# Patient Record
Sex: Female | Born: 1980 | Race: White | Hispanic: No | Smoking: Never smoker
Health system: Southern US, Community
[De-identification: ages and names within clinical notes are randomized; demographics above are authoritative.]

## PROBLEM LIST (undated history)

## (undated) DIAGNOSIS — R55 Syncope and collapse: Secondary | ICD-10-CM

## (undated) DIAGNOSIS — G43909 Migraine, unspecified, not intractable, without status migrainosus: Secondary | ICD-10-CM

## (undated) DIAGNOSIS — H409 Unspecified glaucoma: Secondary | ICD-10-CM

## (undated) DIAGNOSIS — F419 Anxiety disorder, unspecified: Secondary | ICD-10-CM

## (undated) DIAGNOSIS — B977 Papillomavirus as the cause of diseases classified elsewhere: Secondary | ICD-10-CM

## (undated) HISTORY — PX: DILATION AND CURETTAGE OF UTERUS: SHX78

## (undated) HISTORY — PX: COLONOSCOPY: SHX174

## (undated) HISTORY — DX: Unspecified glaucoma: H40.9

## (undated) HISTORY — PX: EYE SURGERY: SHX253

## (undated) HISTORY — DX: Migraine, unspecified, not intractable, without status migrainosus: G43.909

## (undated) HISTORY — PX: BUNIONECTOMY: SHX129

## (undated) HISTORY — DX: Anxiety disorder, unspecified: F41.9

## (undated) HISTORY — DX: Papillomavirus as the cause of diseases classified elsewhere: B97.7

## (undated) HISTORY — DX: Syncope and collapse: R55

---

## 2018-03-17 HISTORY — PX: COLPOSCOPY: SHX161

## 2018-10-05 DIAGNOSIS — M21619 Bunion of unspecified foot: Secondary | ICD-10-CM | POA: Insufficient documentation

## 2018-10-05 DIAGNOSIS — M2010 Hallux valgus (acquired), unspecified foot: Secondary | ICD-10-CM | POA: Insufficient documentation

## 2018-10-05 DIAGNOSIS — M79672 Pain in left foot: Secondary | ICD-10-CM | POA: Insufficient documentation

## 2018-10-05 DIAGNOSIS — M7740 Metatarsalgia, unspecified foot: Secondary | ICD-10-CM | POA: Insufficient documentation

## 2018-10-05 DIAGNOSIS — M2580 Other specified joint disorders, unspecified joint: Secondary | ICD-10-CM | POA: Insufficient documentation

## 2018-12-17 DIAGNOSIS — M2012 Hallux valgus (acquired), left foot: Secondary | ICD-10-CM | POA: Insufficient documentation

## 2018-12-17 DIAGNOSIS — M21612 Bunion of left foot: Secondary | ICD-10-CM | POA: Insufficient documentation

## 2019-01-03 DIAGNOSIS — Z4889 Encounter for other specified surgical aftercare: Secondary | ICD-10-CM | POA: Insufficient documentation

## 2019-11-01 ENCOUNTER — Encounter: Payer: Self-pay | Admitting: Nurse Practitioner

## 2019-11-01 ENCOUNTER — Other Ambulatory Visit: Payer: Self-pay

## 2019-11-01 ENCOUNTER — Ambulatory Visit (INDEPENDENT_AMBULATORY_CARE_PROVIDER_SITE_OTHER): Payer: Managed Care, Other (non HMO) | Admitting: Nurse Practitioner

## 2019-11-01 VITALS — BP 116/72 | Ht 63.0 in | Wt 135.4 lb

## 2019-11-01 DIAGNOSIS — Z8349 Family history of other endocrine, nutritional and metabolic diseases: Secondary | ICD-10-CM

## 2019-11-01 DIAGNOSIS — Z124 Encounter for screening for malignant neoplasm of cervix: Secondary | ICD-10-CM | POA: Diagnosis not present

## 2019-11-01 DIAGNOSIS — Z13220 Encounter for screening for lipoid disorders: Secondary | ICD-10-CM | POA: Diagnosis not present

## 2019-11-01 DIAGNOSIS — Z01419 Encounter for gynecological examination (general) (routine) without abnormal findings: Secondary | ICD-10-CM

## 2019-11-01 NOTE — Patient Instructions (Addendum)
Schedule mammogram! [facility] [phone] [address]  [place], [place] [address]  Health Maintenance, Female Adopting a healthy lifestyle and getting preventive care are important in promoting health and wellness. Ask your health care provider about:  The right schedule for you to have regular tests and exams.  Things you can do on your own to prevent diseases and keep yourself healthy. What should I know about diet, weight, and exercise? Eat a healthy diet   Eat a diet that includes plenty of vegetables, fruits, low-fat dairy products, and lean protein.  Do not eat a lot of foods that are high in solid fats, added sugars, or sodium. Maintain a healthy weight Body mass index (BMI) is used to identify weight problems. It estimates body fat based on height and weight. Your health care provider can help determine your BMI and help you achieve or maintain a healthy weight. Get regular exercise Get regular exercise. This is one of the most important things you can do for your health. Most adults should:  Exercise for at least 150 minutes each week. The exercise should increase your heart rate and make you sweat (moderate-intensity exercise).  Do strengthening exercises at least twice a week. This is in addition to the moderate-intensity exercise.  Spend less time sitting. Even light physical activity can be beneficial. Watch cholesterol and blood lipids Have your blood tested for lipids and cholesterol at 40 years of age, then have this test every 5 years. Have your cholesterol levels checked more often if:  Your lipid or cholesterol levels are high.  You are older than 40 years of age.  You are at high risk for heart disease. What should I know about cancer screening? Depending on your health history and family history, you may need to have cancer screening at various ages. This may include screening for:  Breast cancer.  Cervical  cancer.  Colorectal cancer.  Skin cancer.  Lung cancer. What should I know about heart disease, diabetes, and high blood pressure? Blood pressure and heart disease  High blood pressure causes heart disease and increases the risk of stroke. This is more likely to develop in people who have high blood pressure readings, are of African descent, or are overweight.  Have your blood pressure checked: ? Every 3-5 years if you are 18-39 years of age. ? Every year if you are 40 years old or older. Diabetes Have regular diabetes screenings. This checks your fasting blood sugar level. Have the screening done:  Once every three years after age 40 if you are at a normal weight and have a low risk for diabetes.  More often and at a younger age if you are overweight or have a high risk for diabetes. What should I know about preventing infection? Hepatitis B If you have a higher risk for hepatitis B, you should be screened for this virus. Talk with your health care provider to find out if you are at risk for hepatitis B infection. Hepatitis C Testing is recommended for:  Everyone born from 1945 through 1965.  Anyone with known risk factors for hepatitis C. Sexually transmitted infections (STIs)  Get screened for STIs, including gonorrhea and chlamydia, if: ? You are sexually active and are younger than 40 years of age. ? You are older than 40 years of age and your health care provider tells you that you are at risk for this type of infection. ? Your sexual activity has changed since you   were last screened, and you are at increased risk for chlamydia or gonorrhea. Ask your health care provider if you are at risk.  Ask your health care provider about whether you are at high risk for HIV. Your health care provider may recommend a prescription medicine to help prevent HIV infection. If you choose to take medicine to prevent HIV, you should first get tested for HIV. You should then be tested every 3  months for as long as you are taking the medicine. Pregnancy  If you are about to stop having your period (premenopausal) and you may become pregnant, seek counseling before you get pregnant.  Take 400 to 800 micrograms (mcg) of folic acid every day if you become pregnant.  Ask for birth control (contraception) if you want to prevent pregnancy. Osteoporosis and menopause Osteoporosis is a disease in which the bones lose minerals and strength with aging. This can result in bone fractures. If you are 65 years old or older, or if you are at risk for osteoporosis and fractures, ask your health care provider if you should:  Be screened for bone loss.  Take a calcium or vitamin D supplement to lower your risk of fractures.  Be given hormone replacement therapy (HRT) to treat symptoms of menopause. Follow these instructions at home: Lifestyle  Do not use any products that contain nicotine or tobacco, such as cigarettes, e-cigarettes, and chewing tobacco. If you need help quitting, ask your health care provider.  Do not use street drugs.  Do not share needles.  Ask your health care provider for help if you need support or information about quitting drugs. Alcohol use  Do not drink alcohol if: ? Your health care provider tells you not to drink. ? You are pregnant, may be pregnant, or are planning to become pregnant.  If you drink alcohol: ? Limit how much you use to 0-1 drink a day. ? Limit intake if you are breastfeeding.  Be aware of how much alcohol is in your drink. In the [place] one drink equals one 12 oz bottle of beer (355 mL), one 5 oz glass of wine (148 mL), or one 1 oz glass of hard liquor (44 mL). General instructions  Schedule regular health, dental, and eye exams.  Stay current with your vaccines.  Tell your health care provider if: ? You often feel depressed. ? You have ever been abused or do not feel safe at home. Summary  Adopting a healthy lifestyle and getting  preventive care are important in promoting health and wellness.  Follow your health care provider's instructions about healthy diet, exercising, and getting tested or screened for diseases.  Follow your health care provider's instructions on monitoring your cholesterol and blood pressure. This information is not intended to replace advice given to you by your health care provider. Make sure you discuss any questions you have with your health care provider. Document Revised: 02/24/2018 Document Reviewed: 02/24/2018 Elsevier Patient Education  2020 Elsevier Inc.  

## 2019-11-01 NOTE — Addendum Note (Signed)
Addended by: Tito Dine on: 11/01/2019 11:27 AM   Modules accepted: Orders

## 2019-11-01 NOTE — Progress Notes (Signed)
   Paula Crosby [birth year] [id number]   History:  39 y.o. [id number] presents as new patient to establish care without GYN complaints. Amenorrheic/Mirena IUD inserted June 2020. Sexually active with same female partner. 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 She will have records sent here.   Gynecologic History No LMP recorded. (Menstrual status: IUD).   Contraception: IUD Last Pap: 2020 per patient. 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  Colonoscopy: 2018  Past medical history, past surgical history, family history and social history were all reviewed and documented in the EPIC chart.  ROS:  A ROS was performed and pertinent positives and negatives are included.  Exam:  Vitals:   11/01/19 1028  Weight: 135 lb 6.4 oz (61.4 kg)  Height: 5\' 3"  (1.6 m)   Body mass index is 23.99 kg/m.  General appearance:  Normal Thyroid:  Symmetrical, normal in size, without palpable masses or nodularity. Respiratory  Auscultation:  Clear without wheezing or rhonchi Cardiovascular  Auscultation:  Regular rate, without rubs, murmurs or gallops  Edema/varicosities:  Not grossly evident Abdominal  Soft,nontender, without masses, guarding or rebound.  Liver/spleen:  No organomegaly noted  Hernia:  None appreciated  Skin  Inspection:  Grossly normal   Breasts: Examined lying and sitting.   Right: Without masses, retractions, discharge or axillary adenopathy.   Left: Without masses, retractions, discharge or axillary adenopathy. Gentitourinary   Inguinal/mons:  Normal without inguinal adenopathy  External genitalia:  Normal  BUS/Urethra/Skene's glands:  Normal  Vagina:  Normal  Cervix:  Normal, IUD string visible in os  Uterus:  Anteverted, normal in size, shape and contour.  Midline and mobile  Adnexa/parametria:     Rt: Without masses or tenderness.   Lt: Without masses or tenderness.  Anus and perineum: Normal   Assessment/Plan:  39 y.o. 24 as  new patient.   Well female exam with routine gynecological exam - Plan: CBC with Differential/Platelet, Comprehensive metabolic panel. Education provided on SBEs, importance of preventative screenings, current guidelines, high calcium diet, regular exercise, and multivitamin daily.   Lipid screening - Plan: Lipid panel  Family history of thyroid disease - Plan: TSH  Encounter for Papanicolaou smear for cervical cancer screening - first abnormal pap in 2020, CIN II per patient. Repeat pap with HR HPV today.  Screening for breast cancer - discussed current guidelines and importance of annual screenings. She will start those this year. Information provided on the breast center.   Follow up in 1 year for annual       2021 [facility], 10:35 AM 11/01/2019

## 2019-11-02 LAB — CBC WITH DIFFERENTIAL/PLATELET
Absolute Monocytes: 506 cells/uL (ref 200–950)
Basophils Absolute: 39 cells/uL (ref 0–200)
Basophils Relative: 0.7 %
Eosinophils Absolute: 39 cells/uL (ref 15–500)
Eosinophils Relative: 0.7 %
HCT: 39.2 % (ref 35.0–45.0)
Hemoglobin: 13.4 g/dL (ref 11.7–15.5)
Lymphs Abs: 1551 cells/uL (ref 850–3900)
MCH: 32.5 pg (ref 27.0–33.0)
MCHC: 34.2 g/dL (ref 32.0–36.0)
MCV: 95.1 fL (ref 80.0–100.0)
MPV: 9.9 fL (ref 7.5–12.5)
Monocytes Relative: 9.2 %
Neutro Abs: 3366 cells/uL (ref 1500–7800)
Neutrophils Relative %: 61.2 %
Platelets: 298 10*3/uL (ref 140–400)
RBC: 4.12 10*6/uL (ref 3.80–5.10)
RDW: 12 % (ref 11.0–15.0)
Total Lymphocyte: 28.2 %
WBC: 5.5 10*3/uL (ref 3.8–10.8)

## 2019-11-02 LAB — TSH: TSH: 1.89 mIU/L

## 2019-11-02 LAB — PAP, TP IMAGING W/ HPV RNA, RFLX HPV TYPE 16,18/45: HPV DNA High Risk: NOT DETECTED

## 2019-11-02 LAB — COMPREHENSIVE METABOLIC PANEL
AG Ratio: 1.8 (calc) (ref 1.0–2.5)
ALT: 9 U/L (ref 6–29)
AST: 12 U/L (ref 10–30)
Albumin: 4.6 g/dL (ref 3.6–5.1)
Alkaline phosphatase (APISO): 37 U/L (ref 31–125)
BUN: 7 mg/dL (ref 7–25)
CO2: 27 mmol/L (ref 20–32)
Calcium: 9.4 mg/dL (ref 8.6–10.2)
Chloride: 103 mmol/L (ref 98–110)
Creat: 0.74 mg/dL (ref 0.50–1.10)
Globulin: 2.5 g/dL (calc) (ref 1.9–3.7)
Glucose, Bld: 86 mg/dL (ref 65–99)
Potassium: 4 mmol/L (ref 3.5–5.3)
Sodium: 138 mmol/L (ref 135–146)
Total Bilirubin: 0.6 mg/dL (ref 0.2–1.2)
Total Protein: 7.1 g/dL (ref 6.1–8.1)

## 2019-11-02 LAB — LIPID PANEL
Cholesterol: 172 mg/dL (ref <200)
HDL: 79 mg/dL (ref >=50)
LDL Cholesterol (Calc): 80 mg/dL (calc)
Non-HDL Cholesterol (Calc): 93 mg/dL (calc) (ref <130)
Total CHOL/HDL Ratio: 2.2 (calc) (ref <5.0)
Triglycerides: 54 mg/dL (ref <150)

## 2020-04-12 ENCOUNTER — Other Ambulatory Visit: Payer: Self-pay | Admitting: Nurse Practitioner

## 2020-04-12 DIAGNOSIS — Z1231 Encounter for screening mammogram for malignant neoplasm of breast: Secondary | ICD-10-CM

## 2020-05-07 ENCOUNTER — Other Ambulatory Visit: Payer: Self-pay

## 2020-05-07 ENCOUNTER — Ambulatory Visit
Admission: RE | Admit: 2020-05-07 | Discharge: 2020-05-07 | Disposition: A | Discharge status: Home / Self Care | Payer: Managed Care, Other (non HMO) | Source: Ambulatory Visit

## 2020-05-07 DIAGNOSIS — Z1231 Encounter for screening mammogram for malignant neoplasm of breast: Secondary | ICD-10-CM

## 2020-05-16 ENCOUNTER — Other Ambulatory Visit: Payer: Self-pay

## 2020-05-16 ENCOUNTER — Ambulatory Visit (INDEPENDENT_AMBULATORY_CARE_PROVIDER_SITE_OTHER): Payer: Managed Care, Other (non HMO) | Admitting: Internal Medicine

## 2020-05-16 ENCOUNTER — Encounter: Payer: Self-pay | Admitting: Internal Medicine

## 2020-05-16 VITALS — BP 102/70 | HR 60 | Temp 98.3°F | Resp 16 | Ht 63.0 in | Wt 142.0 lb

## 2020-05-16 DIAGNOSIS — G43011 Migraine without aura, intractable, with status migrainosus: Secondary | ICD-10-CM

## 2020-05-16 DIAGNOSIS — Z0000 Encounter for general adult medical examination without abnormal findings: Secondary | ICD-10-CM | POA: Diagnosis not present

## 2020-05-16 DIAGNOSIS — H409 Unspecified glaucoma: Secondary | ICD-10-CM

## 2020-05-16 DIAGNOSIS — Z0001 Encounter for general adult medical examination with abnormal findings: Secondary | ICD-10-CM

## 2020-05-16 MED ORDER — NURTEC 75 MG PO TBDP: 1.0000 | ORAL_TABLET | ORAL | 1 refills | Status: DC

## 2020-05-16 NOTE — Patient Instructions (Signed)

## 2020-05-16 NOTE — Progress Notes (Addendum)
Subjective:  Patient ID: Paula Crosby, female    DOB: 12-16-1980  Age: 40 y.o. MRN: [id number]  CC: Headache and Annual Exam  This visit occurred during the SARS-CoV-2 public health emergency.  Safety protocols were in place, including screening questions prior to the visit, additional usage of staff PPE, and extensive cleaning of exam room while observing appropriate contact time as indicated for disinfecting solutions.    HPI Frona Yost presents for a CPX and to establish.  She describes intermittent headaches for greater than a year.  She describes the headache as a pounding/throbbing sensation that is always over her right lateral eyebrow.  The pain can be distracting.  She has rare nausea but never vomits.  She says the pain increases whenever she drinks alcohol.  She has gotten modest symptom relief with Tylenol and Excedrin.  She tells me she has no nocturnal headaches and denies slurred speech, visual disturbance, ataxia, or paresthesias.  The headaches have become more severe and more frequent over the last year.  She has a history of glaucoma and is followed closely by ophthalmology.  History Sheresa has a past medical history of Anxiety and HPV (human papilloma virus) infection.   She has a past surgical history that includes Colposcopy (2020); Dilation and curettage of uterus; and Colonoscopy.   Her family history includes Alcohol abuse in her brother and mother; Congestive Heart Failure in her maternal grandfather; Depression in her brother and mother; Diabetes in her maternal grandfather; Heart disease in her maternal grandfather; Hypertension in her brother and maternal grandfather; Migraines in her brother and mother.She reports that she has never smoked. She has never used smokeless tobacco. She reports previous alcohol use. She reports that she does not use drugs.  Outpatient Medications Prior to Visit  Medication Sig Dispense Refill  . melatonin 1 MG TABS tablet Take 3 mg by  mouth at bedtime.    . sertraline (ZOLOFT) 100 MG tablet Take 100 mg by mouth daily.     No facility-administered medications prior to visit.    ROS Review of Systems  Constitutional: Negative.  Negative for appetite change, diaphoresis and fatigue.  HENT: Negative.   Eyes: Negative for pain and visual disturbance.  Respiratory: Negative for cough, chest tightness, shortness of breath and wheezing.   Cardiovascular: Negative for chest pain, palpitations and leg swelling.  Gastrointestinal: Positive for nausea. Negative for abdominal pain and vomiting.  Endocrine: Negative.   Genitourinary: Negative.  Negative for difficulty urinating.  Musculoskeletal: Negative for back pain, myalgias and neck pain.  Skin: Negative.   Neurological: Positive for headaches. Negative for dizziness, weakness, light-headedness and numbness.  Hematological: Negative for adenopathy. Does not bruise/bleed easily.  Psychiatric/Behavioral: Negative.     Objective:  BP 102/70   Pulse 60   Temp 98.3 F (36.8 C) (Oral)   Resp 16   Ht 5\' 3"  (1.6 m)   Wt 142 lb (64.4 kg)   SpO2 99%   BMI 25.15 kg/m   Physical Exam Vitals reviewed.  Constitutional:      General: She is not in acute distress.    Appearance: She is well-developed. She is not ill-appearing, toxic-appearing or diaphoretic.  HENT:     Nose: Nose normal.     Mouth/Throat:     Mouth: Mucous membranes are moist.  Eyes:     General: No scleral icterus.    Extraocular Movements: Extraocular movements intact.     Pupils: Pupils are equal, round, and reactive to light.  Cardiovascular:     Rate and Rhythm: Normal rate and regular rhythm.     Heart sounds: No murmur heard.   Pulmonary:     Effort: Pulmonary effort is normal.     Breath sounds: No stridor. No wheezing, rhonchi or rales.  Abdominal:     General: Abdomen is flat. Bowel sounds are normal. There is no distension.     Palpations: Abdomen is soft. There is no hepatomegaly.      Tenderness: There is no abdominal tenderness.  Musculoskeletal:        General: Normal range of motion.     Cervical back: Neck supple.     Right lower leg: No edema.     Left lower leg: No edema.  Lymphadenopathy:     Cervical: No cervical adenopathy.  Skin:    General: Skin is warm and dry.     Coloration: Skin is not pale.  Neurological:     General: No focal deficit present.     Mental Status: She is alert and oriented to person, place, and time. Mental status is at baseline.     Cranial Nerves: No cranial nerve deficit.     Sensory: No sensory deficit.     Motor: No weakness.     Coordination: Coordination is intact. Romberg sign negative. Coordination normal. Finger-Nose-Finger Test and Heel to [facility] Test normal. Rapid alternating movements normal.     Gait: Gait normal.     Deep Tendon Reflexes: Reflexes normal. Babinski sign absent on the right side. Babinski sign absent on the left side.     Reflex Scores:      Tricep reflexes are 1+ on the right side and 1+ on the left side.      Bicep reflexes are 1+ on the right side and 1+ on the left side.      Brachioradialis reflexes are 1+ on the right side and 1+ on the left side.      Patellar reflexes are 2+ on the right side and 2+ on the left side.      Achilles reflexes are 1+ on the right side and 1+ on the left side. Psychiatric:        Mood and Affect: Mood normal.        Behavior: Behavior normal.     Lab Results  Component Value Date   WBC 5.5 11/01/2019   HGB 13.4 11/01/2019   HCT 39.2 11/01/2019   PLT 298 11/01/2019   GLUCOSE 86 11/01/2019   CHOL 172 11/01/2019   TRIG 54 11/01/2019   HDL 79 11/01/2019   LDLCALC 80 11/01/2019   ALT 9 11/01/2019   AST 12 11/01/2019   NA 138 11/01/2019   K 4.0 11/01/2019   CL 103 11/01/2019   CREATININE 0.74 11/01/2019   BUN 7 11/01/2019   CO2 27 11/01/2019   TSH 1.89 11/01/2019    Assessment & Plan:   Jonne was seen today for headache and annual exam.  Diagnoses  and all orders for this visit:  Intractable migraine without aura and with status migrainosus- She has frequent and severe migraines.  I recommended that she take a CGRP antagonist to prevent and treat migraines. -     Rimegepant Sulfate (NURTEC) 75 MG TBDP; Take 1 tablet by mouth every other day.  Glaucoma of both eyes, unspecified glaucoma type -     Ambulatory referral to Ophthalmology  Encounter for general adult medical examination with abnormal findings- Exam completed, labs reviewed, vaccines  reviewed - She refused a flu vaccine, cancer screenings and labs are up-to-date, patient education material was given.   I am having Gwynn Burly start on Nurtec. I am also having her maintain her sertraline and melatonin.  Meds ordered this encounter  Medications  . Rimegepant Sulfate (NURTEC) 75 MG TBDP    Sig: Take 1 tablet by mouth every other day.    Dispense:  48 tablet    Refill:  1     Follow-up: Return in about 3 months (around 08/16/2020).  Sanda Linger, MD

## 2020-05-21 ENCOUNTER — Encounter: Payer: Self-pay | Admitting: Internal Medicine

## 2020-05-21 DIAGNOSIS — Z0001 Encounter for general adult medical examination with abnormal findings: Secondary | ICD-10-CM | POA: Insufficient documentation

## 2020-05-23 ENCOUNTER — Encounter: Payer: Self-pay | Admitting: Internal Medicine

## 2020-06-08 ENCOUNTER — Telehealth: Payer: Self-pay | Admitting: Internal Medicine

## 2020-06-08 NOTE — Telephone Encounter (Signed)
[facility] called in regards of the [place] for Rimegepant Sulfate (NURTEC) 75 MG TBDP     Phone: [phone]

## 2020-08-29 ENCOUNTER — Other Ambulatory Visit: Payer: Self-pay | Admitting: Internal Medicine

## 2020-08-29 ENCOUNTER — Telehealth: Payer: Self-pay | Admitting: Internal Medicine

## 2020-08-29 DIAGNOSIS — G43011 Migraine without aura, intractable, with status migrainosus: Secondary | ICD-10-CM

## 2020-08-29 MED ORDER — NURTEC 75 MG PO TBDP: 1.0000 | ORAL_TABLET | ORAL | 1 refills | Status: DC

## 2020-08-29 NOTE — Telephone Encounter (Signed)
    Patient has no medication remaining  Request to refill Rimegepant Sulfate (NURTEC) 75 MG TBDP  Pharmacy [facility] [place] (New Address) - [place], [place] - [address] AT Previously: [address], [place]

## 2020-11-01 ENCOUNTER — Encounter: Payer: Self-pay | Admitting: Nurse Practitioner

## 2020-11-01 ENCOUNTER — Other Ambulatory Visit: Payer: Self-pay

## 2020-11-01 ENCOUNTER — Ambulatory Visit (INDEPENDENT_AMBULATORY_CARE_PROVIDER_SITE_OTHER): Payer: Managed Care, Other (non HMO) | Admitting: Nurse Practitioner

## 2020-11-01 VITALS — BP 116/74 | Ht 64.0 in | Wt 142.0 lb

## 2020-11-01 DIAGNOSIS — B9689 Other specified bacterial agents as the cause of diseases classified elsewhere: Secondary | ICD-10-CM | POA: Diagnosis not present

## 2020-11-01 DIAGNOSIS — Z01419 Encounter for gynecological examination (general) (routine) without abnormal findings: Secondary | ICD-10-CM | POA: Diagnosis not present

## 2020-11-01 DIAGNOSIS — N941 Unspecified dyspareunia: Secondary | ICD-10-CM | POA: Diagnosis not present

## 2020-11-01 DIAGNOSIS — N898 Other specified noninflammatory disorders of vagina: Secondary | ICD-10-CM | POA: Diagnosis not present

## 2020-11-01 DIAGNOSIS — N760 Acute vaginitis: Secondary | ICD-10-CM

## 2020-11-01 DIAGNOSIS — Z8349 Family history of other endocrine, nutritional and metabolic diseases: Secondary | ICD-10-CM

## 2020-11-01 DIAGNOSIS — Z30431 Encounter for routine checking of intrauterine contraceptive device: Secondary | ICD-10-CM

## 2020-11-01 LAB — COMPREHENSIVE METABOLIC PANEL
AG Ratio: 2.1 (calc) (ref 1.0–2.5)
ALT: 10 U/L (ref 6–29)
AST: 12 U/L (ref 10–30)
Albumin: 4.4 g/dL (ref 3.6–5.1)
Alkaline phosphatase (APISO): 40 U/L (ref 31–125)
BUN: 11 mg/dL (ref 7–25)
CO2: 26 mmol/L (ref 20–32)
Calcium: 9.2 mg/dL (ref 8.6–10.2)
Chloride: 107 mmol/L (ref 98–110)
Creat: 0.78 mg/dL (ref 0.50–0.99)
Globulin: 2.1 g/dL (calc) (ref 1.9–3.7)
Glucose, Bld: 79 mg/dL (ref 65–99)
Potassium: 4 mmol/L (ref 3.5–5.3)
Sodium: 140 mmol/L (ref 135–146)
Total Bilirubin: 0.5 mg/dL (ref 0.2–1.2)
Total Protein: 6.5 g/dL (ref 6.1–8.1)

## 2020-11-01 LAB — LIPID PANEL
Cholesterol: 149 mg/dL (ref <200)
HDL: 76 mg/dL (ref >=50)
LDL Cholesterol (Calc): 61 mg/dL (calc)
Non-HDL Cholesterol (Calc): 73 mg/dL (calc) (ref <130)
Total CHOL/HDL Ratio: 2 (calc) (ref <5.0)
Triglycerides: 43 mg/dL (ref <150)

## 2020-11-01 LAB — CBC WITH DIFFERENTIAL/PLATELET
Absolute Monocytes: 570 cells/uL (ref 200–950)
Basophils Absolute: 70 cells/uL (ref 0–200)
Basophils Relative: 1.4 %
Eosinophils Absolute: 200 cells/uL (ref 15–500)
Eosinophils Relative: 4 %
HCT: 38.6 % (ref 35.0–45.0)
Hemoglobin: 12.9 g/dL (ref 11.7–15.5)
Lymphs Abs: 1535 cells/uL (ref 850–3900)
MCH: 32.5 pg (ref 27.0–33.0)
MCHC: 33.4 g/dL (ref 32.0–36.0)
MCV: 97.2 fL (ref 80.0–100.0)
MPV: 10.2 fL (ref 7.5–12.5)
Monocytes Relative: 11.4 %
Neutro Abs: 2625 cells/uL (ref 1500–7800)
Neutrophils Relative %: 52.5 %
Platelets: 272 10*3/uL (ref 140–400)
RBC: 3.97 10*6/uL (ref 3.80–5.10)
RDW: 11.7 % (ref 11.0–15.0)
Total Lymphocyte: 30.7 %
WBC: 5 10*3/uL (ref 3.8–10.8)

## 2020-11-01 LAB — WET PREP FOR TRICH, YEAST, CLUE

## 2020-11-01 LAB — TSH: TSH: 1.88 mIU/L

## 2020-11-01 MED ORDER — METRONIDAZOLE 500 MG PO TABS: 500.0000 mg | ORAL_TABLET | Freq: Two times a day (BID) | ORAL | 0 refills | Status: DC

## 2020-11-01 NOTE — Progress Notes (Signed)
Paula Crosby [birth year] [id number]   History:  40 y.o. [id number] presents for annual exam. She complains of vaginal discharge with odor and painful intercourse x 1 month. Amenorrheic/Mirena IUD inserted June 2020. She reports abnormal pap in 2020 in [place], requested records but we have not received them. Normal pap last year. Sexually active with long-term partner.    Gynecologic History No LMP recorded. (Menstrual status: IUD).   Contraception: IUD  Health Maintenance: Last Pap: 11/01/2019 Results were: Normal, 3-year repeat Last mammogram: 05/07/2020. Results were: Normal Last colonoscopy: 2018 Last Dexa: Not indicated  Past medical history, past surgical history, family history and social history were all reviewed and documented in the EPIC chart. 2 sons in grades 3rd and 8th.   ROS:  A ROS was performed and pertinent positives and negatives are included.  Exam:  Vitals:   11/01/20 0759  BP: 116/74  Weight: 142 lb (64.4 kg)  Height: 5\' 4"  (1.626 m)   Body mass index is 24.37 kg/m.  General appearance:  Normal Thyroid:  Symmetrical, normal in size, without palpable masses or nodularity. Respiratory  Auscultation:  Clear without wheezing or rhonchi Cardiovascular  Auscultation:  Regular rate, without rubs, murmurs or gallops  Edema/varicosities:  Not grossly evident Abdominal  Soft,nontender, without masses, guarding or rebound.  Liver/spleen:  No organomegaly noted  Hernia:  None appreciated  Skin  Inspection:  Grossly normal   Breasts: Examined lying and sitting.   Right: Without masses, retractions, discharge or axillary adenopathy.   Left: Without masses, retractions, discharge or axillary adenopathy. Gentitourinary   Inguinal/mons:  Normal without inguinal adenopathy  External genitalia:  Normal  BUS/Urethra/Skene's glands:  Normal  Vagina:  Discharge present  Cervix:  Normal, IUD string visible in os  Uterus:  Anteverted, normal in size, shape and contour.   Midline and mobile  Adnexa/parametria:     Rt: Without masses or tenderness.   Lt: Without masses or tenderness.  Anus and perineum: Normal  Patient informed chaperone available to be present for breast and pelvic exam. Patient has requested no chaperone to be present. Patient has been advised what will be completed during breast and pelvic exam.   Wet prep + clue cells  Assessment/Plan:  40 y.o. 40 as new patient.   Well female exam with routine gynecological exam - Plan: CBC with Differential/Platelet, Comprehensive metabolic panel, Lipid panel. Education provided on SBEs, importance of preventative screenings, current guidelines, high calcium diet, regular exercise, and multivitamin daily.  Family history of thyroid disease - Plan: TSH  Encounter for routine checking of intrauterine contraceptive device (IUD) - Mirena inserted 08/2018. Amenorrheic.   Vaginal discharge - Plan: SURESWAB CT/NG/T. vaginalis, WET PREP FOR TRICH, YEAST, CLUE. Discharge with odor x 1 month.   Dyspareunia in female - Plan: SURESWAB CT/NG/T. vaginalis, WET PREP FOR TRICH, YEAST, CLUE. Painful intercourse x 1 month around the time discharge and odor began.   Bacterial vaginosis - Plan: metroNIDAZOLE (FLAGYL) 500 MG tablet twicce daily x 7 days.  Screening for cervical cancer - Reports abnormal pap in 2020 in [place] confirmed by biopsy, no intervention done. 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 Normal pap last year.  Will repeat at 3-year interval per guidelines.  Screening for breast cancer - Normal mammogram history.  Continue annual screenings.  Normal breast exam today.  Screening for colon cancer - Average risk. Will start screenings at age 40.   Follow up in 1 year for annual  [facility], 8:29 AM 11/01/2020

## 2020-11-02 LAB — SURESWAB CT/NG/T. VAGINALIS
C. trachomatis RNA, TMA: NOT DETECTED
N. gonorrhoeae RNA, TMA: NOT DETECTED
Trichomonas vaginalis RNA: NOT DETECTED

## 2020-11-27 ENCOUNTER — Telehealth: Payer: Self-pay

## 2020-11-27 NOTE — Telephone Encounter (Signed)
Please advise as the pt has called asking for a med refill of her Rimegepant Sulfate (NURTEC) 75 MG TBDP.

## 2020-11-28 NOTE — Telephone Encounter (Signed)
I was able to schedule pt a visit with Dr. Yetta Barre for 12/25/2020.

## 2020-12-07 ENCOUNTER — Telehealth: Payer: Self-pay | Admitting: Internal Medicine

## 2020-12-07 DIAGNOSIS — G43011 Migraine without aura, intractable, with status migrainosus: Secondary | ICD-10-CM

## 2020-12-07 MED ORDER — NURTEC 75 MG PO TBDP
1.0000 | ORAL_TABLET | ORAL | 1 refills | Status: DC
Start: 2020-12-07 — End: 2020-12-25

## 2020-12-07 NOTE — Telephone Encounter (Signed)
1.Medication Requested: Rimegepant Sulfate (NURTEC) 75 MG TBDP  2. Pharmacy (Name, Street, [place]): [facility], [place] (New Address) - [place], [place] - [address] AT Previously: [address]  Phone:  [phone] Fax:  [phone]   3. On Med List: yes  4. Last Visit with PCP: 03.02.22  5. Next visit date with PCP: 10.11.22   Agent: Please be advised that RX refills may take up to 3 business days. We ask that you follow-up with your pharmacy.

## 2020-12-25 ENCOUNTER — Ambulatory Visit (INDEPENDENT_AMBULATORY_CARE_PROVIDER_SITE_OTHER): Payer: Managed Care, Other (non HMO) | Admitting: Internal Medicine

## 2020-12-25 ENCOUNTER — Encounter: Payer: Self-pay | Admitting: Internal Medicine

## 2020-12-25 ENCOUNTER — Other Ambulatory Visit: Payer: Self-pay

## 2020-12-25 DIAGNOSIS — G43011 Migraine without aura, intractable, with status migrainosus: Secondary | ICD-10-CM

## 2020-12-25 MED ORDER — NURTEC 75 MG PO TBDP: 1.0000 | ORAL_TABLET | ORAL | 1 refills | Status: DC

## 2020-12-25 NOTE — Patient Instructions (Signed)
Migraine Headache A migraine headache is an intense, throbbing pain on one side or both sides of the head. Migraine headaches may also cause other symptoms, such as nausea, vomiting, and sensitivity to light and noise. A migraine headache can last from 4 hours to 3 days. Talk with your doctor about what things may bring on (trigger) your migraine headaches. What are the causes? The exact cause of this condition is not known. However, a migraine may be caused when nerves in the brain become irritated and release chemicals that cause inflammation of blood vessels. This inflammation causes pain. This condition may be triggered or caused by: Drinking alcohol. Smoking. Taking medicines, such as: Medicine used to treat chest pain (nitroglycerin). Birth control pills. Estrogen. Certain blood pressure medicines. Eating or drinking products that contain nitrates, glutamate, aspartame, or tyramine. Aged cheeses, chocolate, or caffeine may also be triggers. Doing physical activity. Other things that may trigger a migraine headache include: Menstruation. Pregnancy. Hunger. Stress. Lack of sleep or too much sleep. Weather changes. Fatigue. What increases the risk? The following factors may make you more likely to experience migraine headaches: Being a certain age. This condition is more common in people who are 25-55 years old. Being female. Having a family history of migraine headaches. Being Caucasian. Having a mental health condition, such as depression or anxiety. Being obese. What are the signs or symptoms? The main symptom of this condition is pulsating or throbbing pain. This pain may: Happen in any area of the head, such as on one side or both sides. Interfere with daily activities. Get worse with physical activity. Get worse with exposure to bright lights or loud noises. Other symptoms may include: Nausea. Vomiting. Dizziness. General sensitivity to bright lights, loud noises, or  smells. Before you get a migraine headache, you may get warning signs (an aura). An aura may include: Seeing flashing lights or having blind spots. Seeing bright spots, halos, or zigzag lines. Having tunnel vision or blurred vision. Having numbness or a tingling feeling. Having trouble talking. Having muscle weakness. Some people have symptoms after a migraine headache (postdromal phase), such as: Feeling tired. Difficulty concentrating. How is this diagnosed? A migraine headache can be diagnosed based on: Your symptoms. A physical exam. Tests, such as: CT scan or an MRI of the head. These imaging tests can help rule out other causes of headaches. Taking fluid from the spine (lumbar puncture) and analyzing it (cerebrospinal fluid analysis, or CSF analysis). How is this treated? This condition may be treated with medicines that: Relieve pain. Relieve nausea. Prevent migraine headaches. Treatment for this condition may also include: Acupuncture. Lifestyle changes like avoiding foods that trigger migraine headaches. Biofeedback. Cognitive behavioral therapy. Follow these instructions at home: Medicines Take over-the-counter and prescription medicines only as told by your health care provider. Ask your health care provider if the medicine prescribed to you: Requires you to avoid driving or using heavy machinery. Can cause constipation. You may need to take these actions to prevent or treat constipation: Drink enough fluid to keep your urine pale yellow. Take over-the-counter or prescription medicines. Eat foods that are high in fiber, such as beans, whole grains, and fresh fruits and vegetables. Limit foods that are high in fat and processed sugars, such as fried or sweet foods. Lifestyle Do not drink alcohol. Do not use any products that contain nicotine or tobacco, such as cigarettes, e-cigarettes, and chewing tobacco. If you need help quitting, ask your health care  provider. Get at least 8   hours of sleep every night. Find ways to manage stress, such as meditation, deep breathing, or yoga. General instructions   Keep a journal to find out what may trigger your migraine headaches. For example, write down: What you eat and drink. How much sleep you get. Any change to your diet or medicines. If you have a migraine headache: Avoid things that make your symptoms worse, such as bright lights. It may help to lie down in a dark, quiet room. Do not drive or use heavy machinery. Ask your health care provider what activities are safe for you while you are experiencing symptoms. Keep all follow-up visits as told by your health care provider. This is important. Contact a health care provider if: You develop symptoms that are different or more severe than your usual migraine headache symptoms. You have more than 15 headache days in one month. Get help right away if: Your migraine headache becomes severe. Your migraine headache lasts longer than 72 hours. You have a fever. You have a stiff neck. You have vision loss. Your muscles feel weak or like you cannot control them. You start to lose your balance often. You have trouble walking. You faint. You have a seizure. Summary A migraine headache is an intense, throbbing pain on one side or both sides of the head. Migraines may also cause other symptoms, such as nausea, vomiting, and sensitivity to light and noise. This condition may be treated with medicines and lifestyle changes. You may also need to avoid certain things that trigger a migraine headache. Keep a journal to find out what may trigger your migraine headaches. Contact your health care provider if you have more than 15 headache days in a month or you develop symptoms that are different or more severe than your usual migraine headache symptoms. This information is not intended to replace advice given to you by your health care provider. Make sure you  discuss any questions you have with your health care provider. Document Revised: 06/25/2018 Document Reviewed: 04/15/2018 Elsevier Patient Education  2022 [employer]  

## 2020-12-25 NOTE — Progress Notes (Signed)
Subjective:  Patient ID: Paula Crosby, female    DOB: 1980-12-15  Age: 40 y.o. MRN: [id number]  CC: Follow-up  This visit occurred during the SARS-CoV-2 public health emergency.  Safety protocols were in place, including screening questions prior to the visit, additional usage of staff PPE, and extensive cleaning of exam room while observing appropriate contact time as indicated for disinfecting solutions.    HPI Paula Crosby presents for f/up -   Her headaches are well controlled with nurtec. She would like to continue taking it.  Outpatient Medications Prior to Visit  Medication Sig Dispense Refill   melatonin 1 MG TABS tablet Take 3 mg by mouth at bedtime.     sertraline (ZOLOFT) 100 MG tablet Take 100 mg by mouth daily.     metroNIDAZOLE (FLAGYL) 500 MG tablet Take 1 tablet (500 mg total) by mouth 2 (two) times daily. 14 tablet 0   Rimegepant Sulfate (NURTEC) 75 MG TBDP Take 1 tablet by mouth every other day. 48 tablet 1   No facility-administered medications prior to visit.    ROS Review of Systems  Constitutional:  Negative for fatigue.  HENT: Negative.    Eyes: Negative.  Negative for visual disturbance.  Respiratory:  Negative for cough and shortness of breath.   Cardiovascular:  Negative for leg swelling.  Gastrointestinal:  Negative for abdominal pain, diarrhea, nausea and vomiting.  Genitourinary: Negative.   Musculoskeletal: Negative.   Skin: Negative.   Neurological:  Negative for dizziness, weakness, light-headedness and headaches.  Hematological:  Negative for adenopathy. Does not bruise/bleed easily.  Psychiatric/Behavioral: Negative.     Objective:  BP 114/66 (BP Location: Left Arm, Patient Position: Sitting, Cuff Size: Large)   Pulse 65   Temp 98.2 F (36.8 C) (Oral)   Ht 5\' 4"  (1.626 m)   Wt 140 lb (63.5 kg)   SpO2 98%   BMI 24.03 kg/m   BP Readings from Last 3 Encounters:  12/25/20 114/66  11/01/20 116/74  05/16/20 102/70    Wt Readings from  Last 3 Encounters:  12/25/20 140 lb (63.5 kg)  11/01/20 142 lb (64.4 kg)  05/16/20 142 lb (64.4 kg)    Physical Exam Vitals reviewed.  HENT:     Nose: Nose normal.     Mouth/Throat:     Mouth: Mucous membranes are moist.  Eyes:     Conjunctiva/sclera: Conjunctivae normal.  Cardiovascular:     Rate and Rhythm: Normal rate and regular rhythm.     Pulses: Normal pulses.     Heart sounds: No murmur heard. Pulmonary:     Effort: Pulmonary effort is normal.     Breath sounds: No stridor. No wheezing, rhonchi or rales.  Abdominal:     General: Abdomen is flat.     Palpations: There is no mass.     Tenderness: There is no abdominal tenderness. There is no guarding.     Hernia: No hernia is present.  Skin:    General: Skin is warm and dry.  Neurological:     General: No focal deficit present.     Mental Status: She is alert and oriented to person, place, and time. Mental status is at baseline.    Lab Results  Component Value Date   WBC 5.0 11/01/2020   HGB 12.9 11/01/2020   HCT 38.6 11/01/2020   PLT 272 11/01/2020   GLUCOSE 79 11/01/2020   CHOL 149 11/01/2020   TRIG 43 11/01/2020   HDL 76 11/01/2020   LDLCALC  61 11/01/2020   ALT 10 11/01/2020   AST 12 11/01/2020   NA 140 11/01/2020   K 4.0 11/01/2020   CL 107 11/01/2020   CREATININE 0.78 11/01/2020   BUN 11 11/01/2020   CO2 26 11/01/2020   TSH 1.88 11/01/2020    MM 3D SCREEN BREAST BILATERAL  Result Date: 05/11/2020 CLINICAL DATA:  Screening. EXAM: DIGITAL SCREENING BILATERAL MAMMOGRAM WITH TOMOSYNTHESIS AND CAD TECHNIQUE: Bilateral screening digital craniocaudal and mediolateral oblique mammograms were obtained. Bilateral screening digital breast tomosynthesis was performed. The images were evaluated with computer-aided detection. COMPARISON:  Previous exam(s). ACR Breast Density Category c: The breast tissue is heterogeneously dense, which may obscure small masses. FINDINGS: There are no findings suspicious for  malignancy. IMPRESSION: No mammographic evidence of malignancy. A result letter of this screening mammogram will be mailed directly to the patient. RECOMMENDATION: Screening mammogram in one year. (Code:SM-B-01Y) BI-RADS CATEGORY  1: Negative. Electronically Signed   By: Gerome Sam III M.D   On: 05/11/2020 12:59    Assessment & Plan:   Paula Crosby was seen today for follow-up.  Diagnoses and all orders for this visit:  Intractable migraine without aura and with status migrainosus- Will continue nurtec. -     Rimegepant Sulfate (NURTEC) 75 MG TBDP; Take 1 tablet by mouth every other day.  I have discontinued [name]'s metroNIDAZOLE. I am also having her maintain her sertraline, melatonin, and Nurtec.  Meds ordered this encounter  Medications   Rimegepant Sulfate (NURTEC) 75 MG TBDP    Sig: Take 1 tablet by mouth every other day.    Dispense:  48 tablet    Refill:  1     Follow-up: Return in about 6 months (around 06/25/2021).  Sanda Linger, MD

## 2021-01-22 ENCOUNTER — Other Ambulatory Visit: Payer: Self-pay

## 2021-01-22 ENCOUNTER — Ambulatory Visit (INDEPENDENT_AMBULATORY_CARE_PROVIDER_SITE_OTHER): Payer: Managed Care, Other (non HMO) | Admitting: Nurse Practitioner

## 2021-01-22 ENCOUNTER — Encounter: Payer: Self-pay | Admitting: Nurse Practitioner

## 2021-01-22 VITALS — BP 116/74

## 2021-01-22 DIAGNOSIS — N930 Postcoital and contact bleeding: Secondary | ICD-10-CM | POA: Diagnosis not present

## 2021-01-22 DIAGNOSIS — N898 Other specified noninflammatory disorders of vagina: Secondary | ICD-10-CM

## 2021-01-22 DIAGNOSIS — R140 Abdominal distension (gaseous): Secondary | ICD-10-CM | POA: Diagnosis not present

## 2021-01-22 DIAGNOSIS — N760 Acute vaginitis: Secondary | ICD-10-CM | POA: Diagnosis not present

## 2021-01-22 DIAGNOSIS — R198 Other specified symptoms and signs involving the digestive system and abdomen: Secondary | ICD-10-CM

## 2021-01-22 DIAGNOSIS — B9689 Other specified bacterial agents as the cause of diseases classified elsewhere: Secondary | ICD-10-CM

## 2021-01-22 LAB — WET PREP FOR TRICH, YEAST, CLUE

## 2021-01-22 MED ORDER — METRONIDAZOLE 500 MG PO TABS: 500.0000 mg | ORAL_TABLET | Freq: Two times a day (BID) | ORAL | 0 refills | Status: DC

## 2021-01-22 NOTE — Progress Notes (Signed)
   Acute Office Visit  Subjective:    Patient ID: Paula Crosby, female    DOB: 1980-04-06, 40 y.o.   MRN: [id number]   HPI 40 y.o. presents today for vaginal discharge and odor, painful intercourse and postcoital spotting. Had similar symptoms back in August with BV and symptoms resolved after treatment. Negative STD screen at that visit. Long-term partner. She also complains of intermittent abdominal bloating with alternating diarrhea and constipation.   Review of Systems  Constitutional: Negative.   Gastrointestinal:  Positive for constipation and diarrhea. Negative for nausea and vomiting.       Abdominal bloating  Genitourinary:  Positive for dyspareunia and vaginal discharge.       Vaginal odor, postcoital spotting      Objective:    Physical Exam Constitutional:      Appearance: Normal appearance.  Abdominal:     Palpations: Abdomen is soft.     Tenderness: There is no abdominal tenderness. There is no guarding or rebound.  Genitourinary:    General: Normal vulva.     Vagina: Normal.     Cervix: Normal.    BP 116/74   LMP 12/31/2020  Wt Readings from Last 3 Encounters:  12/25/20 140 lb (63.5 kg)  11/01/20 142 lb (64.4 kg)  05/16/20 142 lb (64.4 kg)   Wet prep + clue cells     Assessment & Plan:   Problem List Items Addressed This Visit   None Visit Diagnoses     Bacterial vaginosis    -  Primary   Relevant Medications   metroNIDAZOLE (FLAGYL) 500 MG tablet   Vaginal discharge       Relevant Orders   WET PREP FOR TRICH, YEAST, CLUE   Postcoital bleeding       Abdominal bloating       Alternating constipation and diarrhea          Plan: Wet prep positive for clue cells - Flagyl 500 mg BID x 7 days. Recommend keeping food diary to help identify possible causes for GI symptoms. Also recommend Women's Probiotic for vaginal and GI health. She would like to try probiotics and if no improvement in GI symptoms she would like a referral. She will return if  vaginal symptoms do not improve.      Olivia Mackie DNP, 9:27 AM 01/22/2021

## 2021-03-05 ENCOUNTER — Encounter: Payer: Self-pay | Admitting: Emergency Medicine

## 2021-03-05 ENCOUNTER — Other Ambulatory Visit: Payer: Self-pay

## 2021-03-05 ENCOUNTER — Ambulatory Visit (INDEPENDENT_AMBULATORY_CARE_PROVIDER_SITE_OTHER): Payer: Managed Care, Other (non HMO) | Admitting: Emergency Medicine

## 2021-03-05 VITALS — BP 112/70 | HR 62 | Ht 64.0 in | Wt 144.0 lb

## 2021-03-05 DIAGNOSIS — R109 Unspecified abdominal pain: Secondary | ICD-10-CM

## 2021-03-05 DIAGNOSIS — M549 Dorsalgia, unspecified: Secondary | ICD-10-CM

## 2021-03-05 DIAGNOSIS — M62830 Muscle spasm of back: Secondary | ICD-10-CM

## 2021-03-05 LAB — POCT URINALYSIS DIP (MANUAL ENTRY)
Bilirubin, UA: NEGATIVE
Blood, UA: NEGATIVE
Glucose, UA: NEGATIVE mg/dL
Ketones, POC UA: NEGATIVE mg/dL
Leukocytes, UA: NEGATIVE
Nitrite, UA: NEGATIVE
Protein Ur, POC: 30 mg/dL — AB
Spec Grav, UA: >=1.03 — AB (ref 1.010–1.025)
Urobilinogen, UA: 0.2 E.U./dL
pH, UA: 6 (ref 5.0–8.0)

## 2021-03-05 MED ORDER — CYCLOBENZAPRINE HCL 10 MG PO TABS: 10.0000 mg | ORAL_TABLET | Freq: Every day | ORAL | 0 refills | Status: DC

## 2021-03-05 MED ORDER — TRAMADOL HCL 50 MG PO TABS: 50.0000 mg | ORAL_TABLET | Freq: Three times a day (TID) | ORAL | 0 refills | Status: AC | PRN

## 2021-03-05 NOTE — Patient Instructions (Signed)

## 2021-03-05 NOTE — Progress Notes (Signed)
Paula Crosby 40 y.o.   Chief Complaint  Patient presents with   Back Pain    Side pain and upset stomach, x 1 week.    HISTORY OF PRESENT ILLNESS: Acute problem visit today.  Patient of Dr. Scarlette Calico. This is a 40 y.o. female complaining of left lumbar pain that started about 1 week ago. Constant sharp pain, waxing and waning, worse with movement, no radiation, not associated with any other symptoms. Denies urinary symptoms. No other complaints or medical concerns today.  Back Pain Pertinent negatives include no abdominal pain, chest pain, dysuria, fever or headaches.    Prior to Admission medications   Medication Sig Start Date End Date Taking? Authorizing Provider  levonorgestrel (MIRENA) 20 MCG/DAY IUD 1 each by Intrauterine route once.   Yes [provider]  melatonin 1 MG TABS tablet Take 3 mg by mouth at bedtime.   Yes [provider]  Rimegepant Sulfate (NURTEC) 75 MG TBDP Take 1 tablet by mouth every other day. 12/25/20  Yes Janith Lima, MD  sertraline (ZOLOFT) 100 MG tablet Take 100 mg by mouth daily.   Yes [provider]    No Known Allergies  Patient Active Problem List   Diagnosis Date Noted   Encounter for general adult medical examination with abnormal findings 05/21/2020   Intractable migraine without aura and with status migrainosus 05/16/2020   Glaucoma of both eyes 05/16/2020    Past Medical History:  Diagnosis Date   Anxiety    HPV (human papilloma virus) infection    Migraines     Past Surgical History:  Procedure Laterality Date   COLONOSCOPY     polyps in small intestine removed    COLPOSCOPY  2020   DILATION AND CURETTAGE OF UTERUS      Social History   Socioeconomic History   Marital status: Legally Separated    Spouse name: Not on file   Number of children: Not on file   Years of education: Not on file   Highest education level: Not on file  Occupational History   Not on file  Tobacco Use    Smoking status: Never   Smokeless tobacco: Never  Vaping Use   Vaping Use: Never used  Substance and Sexual Activity   Alcohol use: Yes    Alcohol/week: 1.0 standard drink    Types: 1 Glasses of wine per week    Comment: RARE   Drug use: Never   Sexual activity: Yes    Partners: Male    Birth control/protection: I.U.D.    Comment: Mirena inserted 08-2018-1st intercourse- 49, partners- 70, current partner- 3 months  Other Topics Concern   Not on file  Social History Narrative   Not on file   Social Determinants of Health   Financial Resource Strain: Not on file  Food Insecurity: Not on file  Transportation Needs: Not on file  Physical Activity: Not on file  Stress: Not on file  Social Connections: Not on file  Intimate Partner Violence: Not on file    Family History  Problem Relation Age of Onset   Heart disease Maternal Grandfather    Congestive Heart Failure Maternal Grandfather    Diabetes Maternal Grandfather    Hypertension Maternal Grandfather    Alcohol abuse Mother    Depression Mother    Migraines Mother    Alcohol abuse Brother    Depression Brother    Hypertension Brother    Migraines Brother      Review  of Systems  Constitutional: Negative.  Negative for chills and fever.  HENT: Negative.  Negative for congestion and sore throat.   Respiratory: Negative.  Negative for cough and shortness of breath.   Cardiovascular: Negative.  Negative for chest pain and palpitations.  Gastrointestinal:  Negative for abdominal pain, diarrhea, nausea and vomiting.  Genitourinary: Negative.  Negative for dysuria, frequency, hematuria and urgency.  Musculoskeletal:  Positive for back pain.  Skin: Negative.  Negative for rash.  Neurological:  Negative for dizziness, sensory change, focal weakness and headaches.  All other systems reviewed and are negative.   Physical Exam Vitals reviewed.  Constitutional:      Appearance: Normal appearance.  HENT:     Head:  Normocephalic.  Eyes:     Extraocular Movements: Extraocular movements intact.     Pupils: Pupils are equal, round, and reactive to light.  Cardiovascular:     Rate and Rhythm: Normal rate.  Pulmonary:     Effort: Pulmonary effort is normal.  Abdominal:     General: There is no distension.     Palpations: Abdomen is soft.     Tenderness: There is no abdominal tenderness. There is no right CVA tenderness or left CVA tenderness.  Musculoskeletal:        General: Normal range of motion.     Cervical back: Normal range of motion.     Right lower leg: No edema.     Left lower leg: No edema.  Skin:    General: Skin is warm and dry.     Capillary Refill: Capillary refill takes less than 2 seconds.  Neurological:     General: No focal deficit present.     Mental Status: She is alert and oriented to person, place, and time.  Psychiatric:        Mood and Affect: Mood normal.        Behavior: Behavior normal.   Results for orders placed or performed in visit on 03/05/21 (from the past 24 hour(s))  POCT urinalysis dipstick     Status: Abnormal   Collection Time: 03/05/21  3:39 PM  Result Value Ref Range   Color, UA yellow yellow   Clarity, UA clear clear   Glucose, UA negative negative mg/dL   Bilirubin, UA negative negative   Ketones, POC UA negative negative mg/dL   Spec Grav, UA >=1.030 (A) 1.010 - 1.025   Blood, UA negative negative   pH, UA 6.0 5.0 - 8.0   Protein Ur, POC =30 (A) negative mg/dL   Urobilinogen, UA 0.2 0.2 or 1.0 E.U./dL   Nitrite, UA Negative Negative   Leukocytes, UA Negative Negative     ASSESSMENT & PLAN: Problem List Items Addressed This Visit   None Visit Diagnoses     Musculoskeletal back pain    -  Primary   Relevant Medications   cyclobenzaprine (FLEXERIL) 10 MG tablet   traMADol (ULTRAM) 50 MG tablet   Flank pain       Relevant Orders   POCT urinalysis dipstick (Completed)   Muscle spasm of back       Relevant Medications   cyclobenzaprine  (FLEXERIL) 10 MG tablet      Patient Instructions  Acute Back Pain, Adult Acute back pain is sudden and usually short-lived. It is often caused by an injury to the muscles and tissues in the back. The injury may result from: A muscle, tendon, or ligament getting overstretched or torn. Ligaments are tissues that connect bones to each  other. Lifting something improperly can cause a back strain. Wear and tear (degeneration) of the spinal disks. Spinal disks are circular tissue that provide cushioning between the bones of the spine (vertebrae). Twisting motions, such as while playing sports or doing yard work. A hit to the back. Arthritis. You may have a physical exam, lab tests, and imaging tests to find the cause of your pain. Acute back pain usually goes away with rest and home care. Follow these instructions at home: Managing pain, stiffness, and swelling Take over-the-counter and prescription medicines only as told by your health care provider. Treatment may include medicines for pain and inflammation that are taken by mouth or applied to the skin, or muscle relaxants. Your health care provider may recommend applying ice during the first 24-48 hours after your pain starts. To do this: Put ice in a plastic bag. Place a towel between your skin and the bag. Leave the ice on for 20 minutes, 2-3 times a day. Remove the ice if your skin turns bright red. This is very important. If you cannot feel pain, heat, or cold, you have a greater risk of damage to the area. If directed, apply heat to the affected area as often as told by your health care provider. Use the heat source that your health care provider recommends, such as a moist heat pack or a heating pad. Place a towel between your skin and the heat source. Leave the heat on for 20-30 minutes. Remove the heat if your skin turns bright red. This is especially important if you are unable to feel pain, heat, or cold. You have a greater risk of  getting burned. Activity  Do not stay in bed. Staying in bed for more than 1-2 days can delay your recovery. Sit up and stand up straight. Avoid leaning forward when you sit or hunching over when you stand. If you work at a desk, sit close to it so you do not need to lean over. Keep your chin tucked in. Keep your neck drawn back, and keep your elbows bent at a 90-degree angle (right angle). Sit high and close to the steering wheel when you drive. Add lower back (lumbar) support to your car seat, if needed. Take short walks on even surfaces as soon as you are able. Try to increase the length of time you walk each day. Do not sit, drive, or stand in one place for more than 30 minutes at a time. Sitting or standing for long periods of time can put stress on your back. Do not drive or use heavy machinery while taking prescription pain medicine. Use proper lifting techniques. When you bend and lift, use positions that put less stress on your back: Dwight your knees. Keep the load close to your body. Avoid twisting. Exercise regularly as told by your health care provider. Exercising helps your back heal faster and helps prevent back injuries by keeping muscles strong and flexible. Work with a physical therapist to make a safe exercise program, as recommended by your health care provider. Do any exercises as told by your physical therapist. Lifestyle Maintain a healthy weight. Extra weight puts stress on your back and makes it difficult to have good posture. Avoid activities or situations that make you feel anxious or stressed. Stress and anxiety increase muscle tension and can make back pain worse. Learn ways to manage anxiety and stress, such as through exercise. General instructions Sleep on a firm mattress in a comfortable position. Try lying on  your side with your knees slightly bent. If you lie on your back, put a pillow under your knees. Keep your head and neck in a straight line with your spine  (neutral position) when using electronic equipment like smartphones or pads. To do this: Raise your smartphone or pad to look at it instead of bending your head or neck to look down. Put the smartphone or pad at the level of your face while looking at the screen. Follow your treatment plan as told by your health care provider. This may include: Cognitive or behavioral therapy. Acupuncture or massage therapy. Meditation or yoga. Contact a health care provider if: You have pain that is not relieved with rest or medicine. You have increasing pain going down into your legs or buttocks. Your pain does not improve after 2 weeks. You have pain at night. You lose weight without trying. You have a fever or chills. You develop nausea or vomiting. You develop abdominal pain. Get help right away if: You develop new bowel or bladder control problems. You have unusual weakness or numbness in your arms or legs. You feel faint. These symptoms may represent a serious problem that is an emergency. Do not wait to see if the symptoms will go away. Get medical help right away. Call your local emergency services (911 in the [place] Do not drive yourself to the hospital. Summary Acute back pain is sudden and usually short-lived. Use proper lifting techniques. When you bend and lift, use positions that put less stress on your back. Take over-the-counter and prescription medicines only as told by your health care provider, and apply heat or ice as told. This information is not intended to replace advice given to you by your health care provider. Make sure you discuss any questions you have with your health care provider. Document Revised: 05/25/2020 Document Reviewed: 05/25/2020 Elsevier Patient Education  2022 Elsevier Inc.     Edwina Barth, MD [facility] at [facility]

## 2021-03-21 ENCOUNTER — Encounter: Payer: Self-pay | Admitting: Nurse Practitioner

## 2021-03-21 ENCOUNTER — Other Ambulatory Visit: Payer: Self-pay

## 2021-03-21 ENCOUNTER — Ambulatory Visit (INDEPENDENT_AMBULATORY_CARE_PROVIDER_SITE_OTHER): Payer: Managed Care, Other (non HMO) | Admitting: Nurse Practitioner

## 2021-03-21 VITALS — BP 90/60 | HR 69

## 2021-03-21 DIAGNOSIS — N760 Acute vaginitis: Secondary | ICD-10-CM

## 2021-03-21 DIAGNOSIS — N898 Other specified noninflammatory disorders of vagina: Secondary | ICD-10-CM | POA: Diagnosis not present

## 2021-03-21 DIAGNOSIS — B9689 Other specified bacterial agents as the cause of diseases classified elsewhere: Secondary | ICD-10-CM

## 2021-03-21 LAB — WET PREP FOR TRICH, YEAST, CLUE

## 2021-03-21 MED ORDER — METRONIDAZOLE 500 MG PO TABS: 500.0000 mg | ORAL_TABLET | Freq: Two times a day (BID) | ORAL | 0 refills | Status: DC

## 2021-03-21 MED ORDER — METRONIDAZOLE 0.75 % VA GEL: 1.0000 | VAGINAL | 0 refills | Status: AC

## 2021-03-21 NOTE — Progress Notes (Signed)
° °  Acute Office Visit  Subjective:    Patient ID: Paula Crosby, female    DOB: 05-13-1980, 41 y.o.   MRN: [id number]   HPI 41 y.o. presents today for clear/mucousy discharge with odor without itching or irritation.1 week ago she had a red-tinged discharge. History of BV, most recent 01/2021 and 10/2020. She has started probiotic since last infection but missed a few doses prior to when symptoms started. No new sexual partners.    Review of Systems  Constitutional: Negative.   Genitourinary:  Positive for vaginal discharge. Negative for vaginal pain.       Odor      Objective:    Physical Exam Constitutional:      Appearance: Normal appearance.  Genitourinary:    General: Normal vulva.     Vagina: Vaginal discharge present.     Cervix: Normal.    BP 90/60    Pulse 69    SpO2 99%  Wt Readings from Last 3 Encounters:  03/05/21 144 lb (65.3 kg)  12/25/20 140 lb (63.5 kg)  11/01/20 142 lb (64.4 kg)   Wet prep + clue cells + odor     Assessment & Plan:   Problem List Items Addressed This Visit   None Visit Diagnoses     Bacterial vaginosis    -  Primary   Relevant Medications   metroNIDAZOLE (METROGEL) 0.75 % vaginal gel   metroNIDAZOLE (FLAGYL) 500 MG tablet   Vaginal discharge       Relevant Orders   WET PREP FOR TRICH, YEAST, CLUE      Plan: Wet prep positive for clue cells - Flagyl 500 mg BID x 7 days. One week following treatment for current infection she will begin Metogel 0.75% twice weekly for 4 months. Continue probiotic. She is agreeable to plan.     Tamela Gammon DNP, 11:25 AM 03/21/2021

## 2021-04-15 ENCOUNTER — Other Ambulatory Visit: Payer: Self-pay | Admitting: Nurse Practitioner

## 2021-04-15 DIAGNOSIS — Z1231 Encounter for screening mammogram for malignant neoplasm of breast: Secondary | ICD-10-CM

## 2021-05-08 ENCOUNTER — Ambulatory Visit
Admission: RE | Admit: 2021-05-08 | Discharge: 2021-05-08 | Disposition: A | Discharge status: Home / Self Care | Payer: Managed Care, Other (non HMO) | Source: Ambulatory Visit | Attending: Nurse Practitioner | Admitting: Nurse Practitioner

## 2021-05-08 DIAGNOSIS — Z1231 Encounter for screening mammogram for malignant neoplasm of breast: Secondary | ICD-10-CM

## 2021-05-09 ENCOUNTER — Encounter: Payer: Self-pay | Admitting: Nurse Practitioner

## 2021-05-09 ENCOUNTER — Other Ambulatory Visit: Payer: Self-pay | Admitting: Nurse Practitioner

## 2021-05-09 DIAGNOSIS — R928 Other abnormal and inconclusive findings on diagnostic imaging of breast: Secondary | ICD-10-CM

## 2021-05-21 ENCOUNTER — Ambulatory Visit
Admission: RE | Admit: 2021-05-21 | Discharge: 2021-05-21 | Disposition: A | Discharge status: Home / Self Care | Payer: Managed Care, Other (non HMO) | Source: Ambulatory Visit | Attending: Nurse Practitioner | Admitting: Nurse Practitioner

## 2021-05-21 ENCOUNTER — Other Ambulatory Visit: Payer: Self-pay | Admitting: Nurse Practitioner

## 2021-05-21 DIAGNOSIS — R928 Other abnormal and inconclusive findings on diagnostic imaging of breast: Secondary | ICD-10-CM

## 2021-05-21 DIAGNOSIS — N6489 Other specified disorders of breast: Secondary | ICD-10-CM

## 2021-07-04 ENCOUNTER — Encounter: Payer: Self-pay | Admitting: Internal Medicine

## 2021-07-04 ENCOUNTER — Ambulatory Visit (INDEPENDENT_AMBULATORY_CARE_PROVIDER_SITE_OTHER): Payer: Managed Care, Other (non HMO) | Admitting: Internal Medicine

## 2021-07-04 VITALS — BP 108/62 | HR 67 | Temp 97.8°F | Ht 64.0 in | Wt 149.0 lb

## 2021-07-04 DIAGNOSIS — F325 Major depressive disorder, single episode, in full remission: Secondary | ICD-10-CM

## 2021-07-04 DIAGNOSIS — G43011 Migraine without aura, intractable, with status migrainosus: Secondary | ICD-10-CM | POA: Diagnosis not present

## 2021-07-04 MED ORDER — SERTRALINE HCL 100 MG PO TABS: 100.0000 mg | ORAL_TABLET | Freq: Every day | ORAL | 1 refills | Status: DC

## 2021-07-04 MED ORDER — NURTEC 75 MG PO TBDP: 1.0000 | ORAL_TABLET | ORAL | 1 refills | Status: DC

## 2021-07-05 ENCOUNTER — Telehealth: Payer: Self-pay

## 2021-07-05 ENCOUNTER — Encounter: Payer: Self-pay | Admitting: Internal Medicine

## 2021-07-05 NOTE — Progress Notes (Signed)
? ?Subjective:  ?Patient ID: Paula BurlyKarena Crosby, female    DOB: May 15, 1980  Age: 41 y.o. MRN: [id number] ? ?CC: Follow-up ? ?HPI ?Paula Crosby presents for f/up - ? ?She feels well today and offers no complaints.  Her headaches are well controlled and she is doing well on the current dose of sertraline.  She would like to continue both. ? ?Outpatient Medications Prior to Visit  ?Medication Sig Dispense Refill  ? cyclobenzaprine (FLEXERIL) 10 MG tablet Take 1 tablet (10 mg total) by mouth at bedtime. 30 tablet 0  ? levonorgestrel (MIRENA) 20 MCG/DAY IUD 1 each by Intrauterine route once.    ? melatonin 1 MG TABS tablet Take 3 mg by mouth at bedtime.    ? metroNIDAZOLE (FLAGYL) 500 MG tablet Take 1 tablet (500 mg total) by mouth 2 (two) times daily. 14 tablet 0  ? metroNIDAZOLE (METROGEL) 0.75 % vaginal gel Place 1 Applicatorful vaginally 2 (two) times a week. Begin 1 week after completing initial treatment of PO Flagyl and use twice weekly x 4 months 340 g 0  ? Rimegepant Sulfate (NURTEC) 75 MG TBDP Take 1 tablet by mouth every other day. 48 tablet 1  ? sertraline (ZOLOFT) 100 MG tablet Take 100 mg by mouth daily.    ? ?No facility-administered medications prior to visit.  ? ? ?ROS ?Review of Systems  ?Constitutional: Negative.   ?HENT: Negative.    ?Eyes: Negative.   ?Endocrine: Negative.   ?All other systems reviewed and are negative. ? ?Objective:  ?BP 108/62 (BP Location: Right Arm, Patient Position: Sitting, Cuff Size: Large)   Pulse 67   Temp 97.8 ?F (36.6 ?C) (Oral)   Ht 5\' 4"  (1.626 m)   Wt 149 lb (67.6 kg)   SpO2 98%   BMI 25.58 kg/m?  ? ?BP Readings from Last 3 Encounters:  ?07/04/21 108/62  ?03/21/21 90/60  ?03/05/21 112/70  ? ? ?Wt Readings from Last 3 Encounters:  ?07/04/21 149 lb (67.6 kg)  ?03/05/21 144 lb (65.3 kg)  ?12/25/20 140 lb (63.5 kg)  ? ? ?Physical Exam ?Vitals reviewed.  ?HENT:  ?   Mouth/Throat:  ?   Mouth: Mucous membranes are moist.  ?Eyes:  ?   General: No scleral icterus. ?Cardiovascular:   ?   Rate and Rhythm: Normal rate and regular rhythm.  ?   Heart sounds: No murmur heard. ?Pulmonary:  ?   Effort: Pulmonary effort is normal.  ?   Breath sounds: No stridor. No wheezing, rhonchi or rales.  ?Abdominal:  ?   General: Abdomen is flat.  ?   Palpations: There is no mass.  ?   Tenderness: There is no abdominal tenderness. There is no guarding.  ?   Hernia: No hernia is present.  ?Musculoskeletal:  ?   Cervical back: Neck supple.  ?Skin: ?   General: Skin is warm and dry.  ?   Findings: No lesion or rash.  ?Neurological:  ?   General: No focal deficit present.  ?   Mental Status: She is oriented to person, place, and time. Mental status is at baseline.  ?   Deep Tendon Reflexes: Reflexes normal.  ?Psychiatric:     ?   Mood and Affect: Mood normal.     ?   Behavior: Behavior normal.  ? ? ?Lab Results  ?Component Value Date  ? WBC 5.0 11/01/2020  ? HGB 12.9 11/01/2020  ? HCT 38.6 11/01/2020  ? PLT 272 11/01/2020  ? GLUCOSE 79 11/01/2020  ?  CHOL 149 11/01/2020  ? TRIG 43 11/01/2020  ? HDL 76 11/01/2020  ? LDLCALC 61 11/01/2020  ? ALT 10 11/01/2020  ? AST 12 11/01/2020  ? NA 140 11/01/2020  ? K 4.0 11/01/2020  ? CL 107 11/01/2020  ? CREATININE 0.78 11/01/2020  ? BUN 11 11/01/2020  ? CO2 26 11/01/2020  ? TSH 1.88 11/01/2020  ? ? ?US BREAST LTD UNI LEFT INC AXILLA ? ?Addendum Date: 05/24/2021   ?ADDENDUM REPORT: 05/24/2021 12:07 ADDENDUM: LEFT breast diagnostic mammogram was performed in conjunction with this LEFT breast ultrasound. Exam heading should read: DIGITAL DIAGNOSTIC UNILATERAL LEFT MAMMOGRAM WITH TOMOSYNTHESIS AND CAD; LEFT BREAST ULTRASOUND Electronically Signed   By: Bary Richard M.D.   On: 05/24/2021 12:07  ? ?Result Date: 05/24/2021 ?CLINICAL DATA:  Patient returns today to evaluate a possible LEFT breast asymmetry questioned on recent screening mammogram. EXAM: DIGITAL DIAGNOSTIC UNILATERAL LEFT MAMMOGRAM WITH TOMOSYNTHESIS AND CAD TECHNIQUE: Left digital diagnostic mammography and breast  tomosynthesis was performed. The images were evaluated with computer-aided detection. COMPARISON:  Previous exams including recent screening mammogram dated 05/08/2021. ACR Breast Density Category c: The breast tissue is heterogeneously dense, which may obscure small masses. FINDINGS: On today's additional diagnostic views, including spot compression with 3D tomosynthesis, the questioned asymmetry within the upper LEFT breast is most compatible with superimposition of normal dense fibroglandular tissues. Ultrasound will be performed for further characterization. Targeted ultrasound is performed, evaluating the upper-outer quadrant, showing only normal fibroglandular tissues and fat lobules throughout. No solid or cystic mass. IMPRESSION: Probably benign superimposition of normal dense fibroglandular tissues within the upper LEFT breast. Recommend follow-up LEFT breast diagnostic mammogram in 6 months to ensure stability. RECOMMENDATION: LEFT breast diagnostic mammogram in 6 months. I have discussed the findings and recommendations with the patient. If applicable, a reminder letter will be sent to the patient regarding the next appointment. BI-RADS CATEGORY  3: Probably benign. Electronically Signed: By: Bary Richard M.D. On: 05/23/2021 12:41  ? ?MM DIAG BREAST TOMO UNI LEFT ? ?Addendum Date: 05/24/2021   ?ADDENDUM REPORT: 05/24/2021 12:06 ADDENDUM: A LEFT breast ultrasound was performed in conjunction with this LEFT breast diagnostic mammogram. Exams were inadvertently not linked as 1 report. Electronically Signed   By: Bary Richard M.D.   On: 05/24/2021 12:06  ? ?Result Date: 05/24/2021 ?CLINICAL DATA:  Patient returns today to evaluate a possible LEFT breast asymmetry questioned on recent screening mammogram. EXAM: DIGITAL DIAGNOSTIC UNILATERAL LEFT MAMMOGRAM WITH TOMOSYNTHESIS AND CAD TECHNIQUE: Left digital diagnostic mammography and breast tomosynthesis was performed. The images were evaluated with  computer-aided detection. COMPARISON:  Previous exams including recent screening mammogram dated 05/08/2021. ACR Breast Density Category c: The breast tissue is heterogeneously dense, which may obscure small masses. FINDINGS: On today's additional diagnostic views, including spot compression with 3D tomosynthesis, the questioned asymmetry within the upper LEFT breast is most compatible with superimposition of normal dense fibroglandular tissues. Ultrasound will be performed for further characterization. Targeted ultrasound is performed, evaluating the upper-outer quadrant, showing only normal fibroglandular tissues and fat lobules throughout. No solid or cystic mass. IMPRESSION: Probably benign superimposition of normal dense fibroglandular tissues within the upper LEFT breast. Recommend follow-up LEFT breast diagnostic mammogram in 6 months to ensure stability. RECOMMENDATION: LEFT breast diagnostic mammogram in 6 months. I have discussed the findings and recommendations with the patient. If applicable, a reminder letter will be sent to the patient regarding the next appointment. BI-RADS CATEGORY  3: Probably benign. Electronically Signed: By: Bary Richard  M.D. On: 05/21/2021 08:32 ? ? ?Assessment & Plan:  ? ?Anysia was seen today for follow-up. ? ?Diagnoses and all orders for this visit: ? ?Major depressive disorder with single episode, in full remission (HCC) ?-     sertraline (ZOLOFT) 100 MG tablet; Take 1 tablet (100 mg total) by mouth daily. ? ?Intractable migraine without aura and with status migrainosus ?-     Rimegepant Sulfate (NURTEC) 75 MG TBDP; Take 1 tablet by mouth every other day. ? ? ?I have changed Majesty More's sertraline. I am also having her maintain her melatonin, levonorgestrel, cyclobenzaprine, metroNIDAZOLE, metroNIDAZOLE, and Nurtec. ? ?Meds ordered this encounter  ?Medications  ? Rimegepant Sulfate (NURTEC) 75 MG TBDP  ?  Sig: Take 1 tablet by mouth every other day.  ?  Dispense:  48  tablet  ?  Refill:  1  ? sertraline (ZOLOFT) 100 MG tablet  ?  Sig: Take 1 tablet (100 mg total) by mouth daily.  ?  Dispense:  90 tablet  ?  Refill:  1  ? ? ? ?Follow-up: No follow-ups on file. ? ?Sanda Linger, MD ?

## 2021-07-05 NOTE — Telephone Encounter (Signed)
Key: [id number] ?

## 2021-07-08 ENCOUNTER — Other Ambulatory Visit: Payer: Self-pay | Admitting: Internal Medicine

## 2021-07-08 DIAGNOSIS — G43011 Migraine without aura, intractable, with status migrainosus: Secondary | ICD-10-CM

## 2021-07-08 MED ORDER — QULIPTA 30 MG PO TABS: 1.0000 | ORAL_TABLET | Freq: Every day | ORAL | 1 refills | Status: DC

## 2021-07-08 NOTE — Telephone Encounter (Signed)
Per CoverMyMeds: ? ?PA was denied.  ?

## 2021-07-12 NOTE — Telephone Encounter (Addendum)
[employer] has reviewed the PA and has now issued an approval for Nurtec 75mg  for the dates of 07/12/21-07/13/22. ? ?Determination sent to scan.  ?

## 2021-07-13 ENCOUNTER — Other Ambulatory Visit: Payer: Self-pay | Admitting: Internal Medicine

## 2021-07-13 DIAGNOSIS — G43011 Migraine without aura, intractable, with status migrainosus: Secondary | ICD-10-CM

## 2021-07-13 MED ORDER — NURTEC 75 MG PO TBDP: 1.0000 | ORAL_TABLET | ORAL | 1 refills | Status: DC

## 2021-10-11 ENCOUNTER — Other Ambulatory Visit: Payer: Self-pay | Admitting: Internal Medicine

## 2021-10-11 DIAGNOSIS — F325 Major depressive disorder, single episode, in full remission: Secondary | ICD-10-CM

## 2021-10-23 ENCOUNTER — Encounter: Payer: Self-pay | Admitting: Nurse Practitioner

## 2021-11-03 NOTE — Progress Notes (Unsigned)
Paula Crosby Jan 04, 1981 [id number]   History:  41 y.o. [id number] presents for annual exam. Reports fatigue and night sweats x 1 month. Wakes up about 7 times per night due to sweating. She sleeps good otherwise and feels sleep is restful. No more stressed than normal. Mirena IUD inserted June 2020, amenorrheic. She reports abnormal pap in 2020 in [place], requested records but we have not received them, normal pap in 2021.   Gynecologic History No LMP recorded. (Menstrual status: IUD).   Contraception: IUD Sexually active: Yes  Health Maintenance: Last Pap: 11/01/2019 Results were: Normal, 3-year repeat Last mammogram: 05/08/2021, left ultrasound 05/21/2021. Results were: Fibroglandular tissue in left breast, 52-month follow up 9/8 Last colonoscopy: 2018 Last Dexa: Not indicated  Past medical history, past surgical history, family history and social history were all reviewed and documented in the EPIC chart. From [place]. 11 yo and 32 yo sons. Emergency planning/management officer for [employer].  ROS:  A ROS was performed and pertinent positives and negatives are included.  Exam:  Vitals:   11/04/21 0812  BP: 104/64  Pulse: 67  SpO2: 98%  Weight: 144 lb (65.3 kg)  Height: 5\' 4"  (1.626 m)    Body mass index is 24.72 kg/m.  General appearance:  Normal Thyroid:  Symmetrical, normal in size, without palpable masses or nodularity. Respiratory  Auscultation:  Clear without wheezing or rhonchi Cardiovascular  Auscultation:  Regular rate, without rubs, murmurs or gallops  Edema/varicosities:  Not grossly evident Abdominal  Soft,nontender, without masses, guarding or rebound.  Liver/spleen:  No organomegaly noted  Hernia:  None appreciated  Skin  Inspection:  Grossly normal   Breasts: Examined lying and sitting.   Right: Without masses, retractions, discharge or axillary adenopathy.   Left: Without masses, retractions, discharge or axillary adenopathy. Genitourinary   Inguinal/mons:  Normal without  inguinal adenopathy  External genitalia:  Normal appearing vulva with no masses, tenderness, or lesions  BUS/Urethra/Skene's glands:  Normal  Vagina:  Normal appearing with normal color and discharge, no lesions  Cervix:  Normal appearing without discharge or lesions. IUD string visible  Uterus:  Normal in size, shape and contour.  Midline and mobile, nontender  Adnexa/parametria:     Rt: Normal in size, without masses or tenderness.   Lt: Normal in size, without masses or tenderness.  Anus and perineum: Normal  Digital rectal exam: Normal sphincter tone without palpated masses or tenderness  Patient informed chaperone available to be present for breast and pelvic exam. Patient has requested no chaperone to be present. Patient has been advised what will be completed during breast and pelvic exam.   Assessment/Plan:  41 y.o. 41 for annual exam.   Well female exam with routine gynecological exam - Plan: CBC with Differential/Platelet, Comprehensive metabolic panel. Education provided on SBEs, importance of preventative screenings, current guidelines, high calcium diet, regular exercise, and multivitamin daily.   Encounter for routine checking of intrauterine contraceptive device (IUD) - Mirena inserted 08/2018. Amenorrheic. IUD string visible during exam.   Family history of thyroid disease - Plan: TSH  Fatigue, unspecified type - Plan: TSH, Vitamin B12, VITAMIN D 25 Hydroxy (Vit-D Deficiency, Fractures). Fatigue x 1 month. Could be s/t night sweats and interrupted sleep.   Night sweats - Plan: TSH, Estradiol, Follicle stimulating hormone. X 1 month.   Screening for cervical cancer - Reports abnormal pap in 2020 in [place] confirmed by biopsy, no intervention done. Normal pap 2021. Will repeat at 3-year interval per guidelines.  Screening for breast  cancer -  Normal breast exam today. 81-month follow up scheduled 9/8.  Screening for colon cancer - Colonoscopy in 2018.  Follow up in 1 year  for annual.      Olivia Mackie [facility], 8:22 AM 11/04/2021

## 2021-11-04 ENCOUNTER — Encounter: Payer: Self-pay | Admitting: Nurse Practitioner

## 2021-11-04 ENCOUNTER — Ambulatory Visit (INDEPENDENT_AMBULATORY_CARE_PROVIDER_SITE_OTHER): Payer: Managed Care, Other (non HMO) | Admitting: Nurse Practitioner

## 2021-11-04 VITALS — BP 104/64 | HR 67 | Ht 64.0 in | Wt 144.0 lb

## 2021-11-04 DIAGNOSIS — R61 Generalized hyperhidrosis: Secondary | ICD-10-CM

## 2021-11-04 DIAGNOSIS — Z01419 Encounter for gynecological examination (general) (routine) without abnormal findings: Secondary | ICD-10-CM | POA: Diagnosis not present

## 2021-11-04 DIAGNOSIS — R5383 Other fatigue: Secondary | ICD-10-CM | POA: Diagnosis not present

## 2021-11-04 DIAGNOSIS — Z30431 Encounter for routine checking of intrauterine contraceptive device: Secondary | ICD-10-CM

## 2021-11-04 DIAGNOSIS — Z8349 Family history of other endocrine, nutritional and metabolic diseases: Secondary | ICD-10-CM | POA: Diagnosis not present

## 2021-11-05 ENCOUNTER — Other Ambulatory Visit: Payer: Self-pay | Admitting: Nurse Practitioner

## 2021-11-05 DIAGNOSIS — E559 Vitamin D deficiency, unspecified: Secondary | ICD-10-CM

## 2021-11-05 LAB — TSH: TSH: 1.97 mIU/L

## 2021-11-05 LAB — COMPREHENSIVE METABOLIC PANEL
AG Ratio: 1.8 (calc) (ref 1.0–2.5)
ALT: 13 U/L (ref 6–29)
AST: 16 U/L (ref 10–30)
Albumin: 4.3 g/dL (ref 3.6–5.1)
Alkaline phosphatase (APISO): 37 U/L (ref 31–125)
BUN: 9 mg/dL (ref 7–25)
CO2: 27 mmol/L (ref 20–32)
Calcium: 9.2 mg/dL (ref 8.6–10.2)
Chloride: 106 mmol/L (ref 98–110)
Creat: 0.88 mg/dL (ref 0.50–0.99)
Globulin: 2.4 g/dL (calc) (ref 1.9–3.7)
Glucose, Bld: 81 mg/dL (ref 65–99)
Potassium: 4.4 mmol/L (ref 3.5–5.3)
Sodium: 139 mmol/L (ref 135–146)
Total Bilirubin: 0.5 mg/dL (ref 0.2–1.2)
Total Protein: 6.7 g/dL (ref 6.1–8.1)

## 2021-11-05 LAB — CBC WITH DIFFERENTIAL/PLATELET
Absolute Monocytes: 530 cells/uL (ref 200–950)
Basophils Absolute: 71 cells/uL (ref 0–200)
Basophils Relative: 1.4 %
Eosinophils Absolute: 133 cells/uL (ref 15–500)
Eosinophils Relative: 2.6 %
HCT: 39.3 % (ref 35.0–45.0)
Hemoglobin: 13.6 g/dL (ref 11.7–15.5)
Lymphs Abs: 1617 cells/uL (ref 850–3900)
MCH: 33.6 pg — ABNORMAL HIGH (ref 27.0–33.0)
MCHC: 34.6 g/dL (ref 32.0–36.0)
MCV: 97 fL (ref 80.0–100.0)
MPV: 10.3 fL (ref 7.5–12.5)
Monocytes Relative: 10.4 %
Neutro Abs: 2749 cells/uL (ref 1500–7800)
Neutrophils Relative %: 53.9 %
Platelets: 295 10*3/uL (ref 140–400)
RBC: 4.05 10*6/uL (ref 3.80–5.10)
RDW: 11.8 % (ref 11.0–15.0)
Total Lymphocyte: 31.7 %
WBC: 5.1 10*3/uL (ref 3.8–10.8)

## 2021-11-05 LAB — VITAMIN B12: Vitamin B-12: 361 pg/mL (ref 200–1100)

## 2021-11-05 LAB — FOLLICLE STIMULATING HORMONE: FSH: 14 m[IU]/mL

## 2021-11-05 LAB — ESTRADIOL: Estradiol: 55 pg/mL

## 2021-11-05 LAB — VITAMIN D 25 HYDROXY (VIT D DEFICIENCY, FRACTURES): Vit D, 25-Hydroxy: 25 ng/mL — ABNORMAL LOW (ref 30–100)

## 2021-11-05 MED ORDER — VITAMIN D (ERGOCALCIFEROL) 1.25 MG (50000 UNIT) PO CAPS: 50000.0000 [IU] | ORAL_CAPSULE | ORAL | 0 refills | Status: AC

## 2021-11-22 ENCOUNTER — Ambulatory Visit
Admission: RE | Admit: 2021-11-22 | Discharge: 2021-11-22 | Disposition: A | Discharge status: Home / Self Care | Payer: Managed Care, Other (non HMO) | Source: Ambulatory Visit | Attending: Nurse Practitioner | Admitting: Nurse Practitioner

## 2021-11-22 ENCOUNTER — Other Ambulatory Visit: Payer: Self-pay | Admitting: Nurse Practitioner

## 2021-11-22 DIAGNOSIS — N6489 Other specified disorders of breast: Secondary | ICD-10-CM

## 2021-11-28 ENCOUNTER — Telehealth: Payer: Self-pay | Admitting: Internal Medicine

## 2021-11-28 DIAGNOSIS — G43011 Migraine without aura, intractable, with status migrainosus: Secondary | ICD-10-CM

## 2021-11-28 MED ORDER — NURTEC 75 MG PO TBDP: 1.0000 | ORAL_TABLET | ORAL | 1 refills | Status: DC

## 2021-11-28 NOTE — Telephone Encounter (Signed)
Patient needs her Nurtec refilled - please send to [facility] on [address] [facility]acy told patient that they are waiting on a prior authorization

## 2021-11-28 NOTE — Telephone Encounter (Signed)
PA was approved in April.  See phone note.

## 2021-11-28 NOTE — Addendum Note (Signed)
Addended by: Darryll Capers on: 11/28/2021 10:23 AM   Modules accepted: Orders

## 2021-12-28 ENCOUNTER — Other Ambulatory Visit: Payer: Self-pay | Admitting: Nurse Practitioner

## 2021-12-28 DIAGNOSIS — E559 Vitamin D deficiency, unspecified: Secondary | ICD-10-CM

## 2021-12-30 NOTE — Telephone Encounter (Signed)
Med refill request: Vit D 50,000IU Last AEX: 11/04/21/ TW Next AEX: 11/06/22/ TW Last MMG (if hormonal med) N/A Refill authorized:   Rx refused. Per review of Epic, per VIT D results dated 11/04/21,  One week after completing Vit D, start Vit D 1000 IU daily for maintenance.   Routing to Provider for final review.  Encounter closed.

## 2022-02-10 ENCOUNTER — Ambulatory Visit (INDEPENDENT_AMBULATORY_CARE_PROVIDER_SITE_OTHER): Payer: Managed Care, Other (non HMO) | Admitting: Obstetrics and Gynecology

## 2022-02-10 ENCOUNTER — Encounter: Payer: Self-pay | Admitting: Obstetrics and Gynecology

## 2022-02-10 VITALS — BP 104/62 | HR 66 | Temp 98.0°F | Wt 145.0 lb

## 2022-02-10 DIAGNOSIS — B9689 Other specified bacterial agents as the cause of diseases classified elsewhere: Secondary | ICD-10-CM | POA: Diagnosis not present

## 2022-02-10 DIAGNOSIS — N760 Acute vaginitis: Secondary | ICD-10-CM | POA: Diagnosis not present

## 2022-02-10 LAB — WET PREP FOR TRICH, YEAST, CLUE

## 2022-02-10 MED ORDER — METRONIDAZOLE 500 MG PO TABS: 500.0000 mg | ORAL_TABLET | Freq: Two times a day (BID) | ORAL | 0 refills | Status: DC

## 2022-02-10 NOTE — Progress Notes (Signed)
GYNECOLOGY  VISIT   HPI: 41 y.o.   Legally Separated White or Caucasian Not Hispanic or Latino  female   [phone] with No LMP recorded. (Menstrual status: IUD).   here for possible BV. She is having vaginal odor.   She has a mirena IUD, she started bleeding lightly 8 days ago (doesn't cycle with the IUD). She noticed the odor ~5 days. Now just spotting, still has the odor and an increase in vaginal d/c. No itching, burning or irritation. Same partner x 2.5 years, no STD concerns.   GYNECOLOGIC HISTORY: No LMP recorded. (Menstrual status: IUD). Contraception:IUD Menopausal hormone therapy: none         OB History     Gravida  3   Para  2   Term      Preterm      AB  1   Living  2      SAB  1   IAB      Ectopic      Multiple      Live Births                 Patient Active Problem List   Diagnosis Date Noted   Encounter for general adult medical examination with abnormal findings 05/21/2020   Intractable migraine without aura and with status migrainosus 05/16/2020   Glaucoma of both eyes 05/16/2020    Past Medical History:  Diagnosis Date   Anxiety    HPV (human papilloma virus) infection    Migraines     Past Surgical History:  Procedure Laterality Date   COLONOSCOPY     polyps in small intestine removed    COLPOSCOPY  2020   DILATION AND CURETTAGE OF UTERUS      Current Outpatient Medications  Medication Sig Dispense Refill   levonorgestrel (MIRENA) 20 MCG/DAY IUD 1 each by Intrauterine route once.     melatonin 1 MG TABS tablet Take 3 mg by mouth at bedtime.     Rimegepant Sulfate (NURTEC) 75 MG TBDP Take 1 tablet by mouth every other day. 48 tablet 1   sertraline (ZOLOFT) 100 MG tablet TAKE 1 TABLET(100 MG) BY MOUTH DAILY 90 tablet 1   No current facility-administered medications for this visit.     ALLERGIES: Patient has no known allergies.  Family History  Problem Relation Age of Onset   Heart disease Maternal Grandfather     Congestive Heart Failure Maternal Grandfather    Diabetes Maternal Grandfather    Hypertension Maternal Grandfather    Alcohol abuse Mother    Depression Mother    Migraines Mother    Alcohol abuse Brother    Depression Brother    Hypertension Brother    Migraines Brother     Social History   Socioeconomic History   Marital status: Legally Separated    Spouse name: Not on file   Number of children: Not on file   Years of education: Not on file   Highest education level: Not on file  Occupational History   Not on file  Tobacco Use   Smoking status: Never   Smokeless tobacco: Never  Vaping Use   Vaping Use: Never used  Substance and Sexual Activity   Alcohol use: Yes    Alcohol/week: 1.0 standard drink of alcohol    Types: 1 Glasses of wine per week    Comment: RARE   Drug use: Never   Sexual activity: Yes    Partners: Male    Birth  control/protection: I.U.D.    Comment: Mirena inserted 10/07/2018; 1st intercourse- 15, partners- 6, current partner- 3 months  Other Topics Concern   Not on file  Social History Narrative   Not on file   Social Determinants of Health   Financial Resource Strain: Not on file  Food Insecurity: Not on file  Transportation Needs: Not on file  Physical Activity: Not on file  Stress: Not on file  Social Connections: Not on file  Intimate Partner Violence: Not on file    Review of Systems  All other systems reviewed and are negative.   PHYSICAL EXAMINATION:    BP 104/62   Pulse 66   Temp 98 F (36.7 C)   Wt 145 lb (65.8 kg)   SpO2 99%   BMI 24.89 kg/m     General appearance: alert, cooperative and appears stated age   Pelvic: External genitalia:  no lesions              Urethra:  normal appearing urethra with no masses, tenderness or lesions              Bartholins and Skenes: normal                 Vagina: normal appearing vagina with normal color and discharge, no lesions, odor noted              Cervix: no lesions                 Chaperone was present for exam.  1. Acute vaginitis - WET PREP FOR TRICH, YEAST, CLUE  2. Bacterial vaginosis - metroNIDAZOLE (FLAGYL) 500 MG tablet; Take 1 tablet (500 mg total) by mouth 2 (two) times daily.  Dispense: 14 tablet; Refill: 0

## 2022-02-10 NOTE — Patient Instructions (Signed)

## 2022-03-03 IMAGING — MG MM DIGITAL SCREENING BILAT W/ TOMO AND CAD
8 series · 8 of 24 positions shown · non-contrast
Comparison: Previous exam(s).

CLINICAL DATA: Screening.

EXAM:
DIGITAL SCREENING BILATERAL MAMMOGRAM WITH TOMOSYNTHESIS AND CAD
TECHNIQUE: Bilateral screening digital craniocaudal and mediolateral oblique
mammograms were obtained. Bilateral screening digital breast
tomosynthesis was performed. The images were evaluated with
computer-aided detection.

[R CC synth-2D]
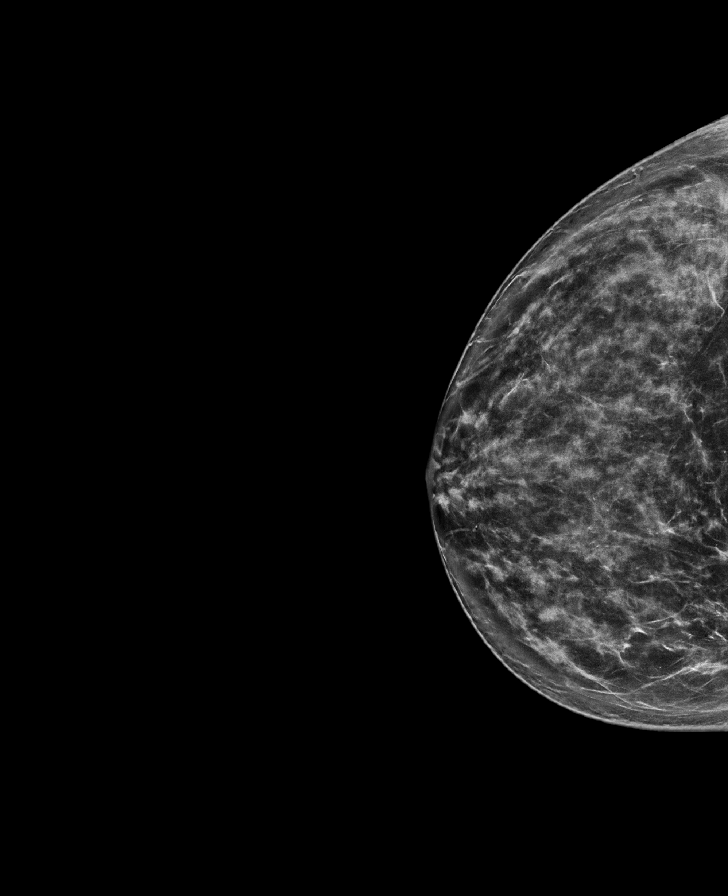

[L CC synth-2D]
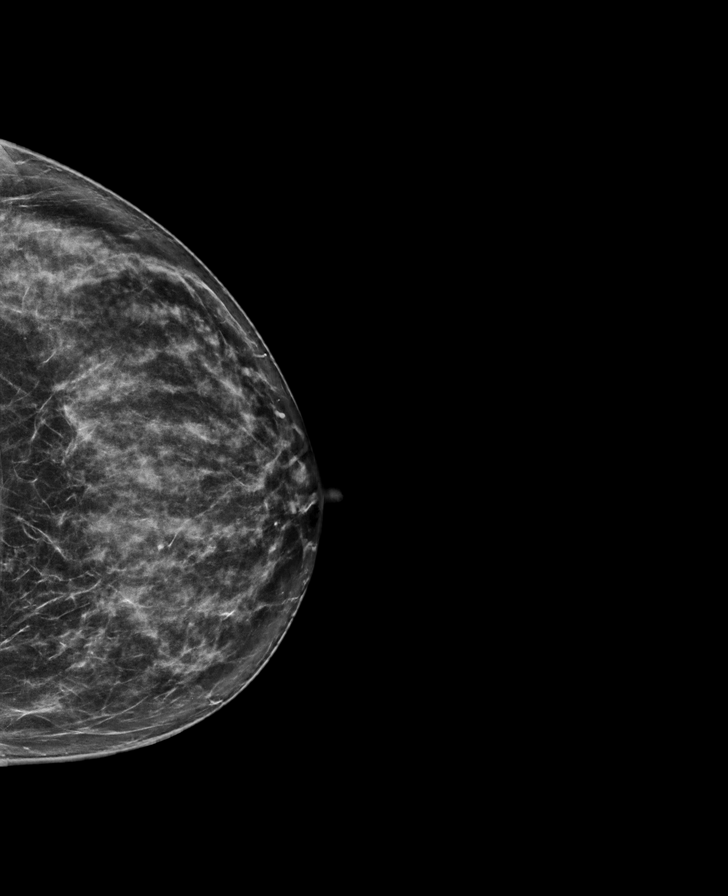

[R MLO synth-2D]
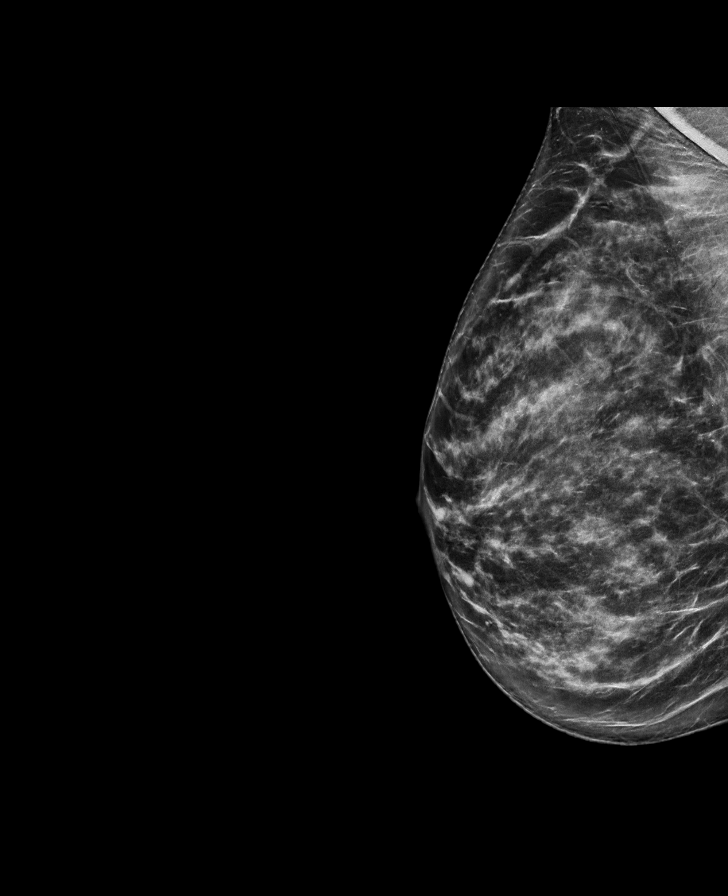

[L MLO synth-2D]
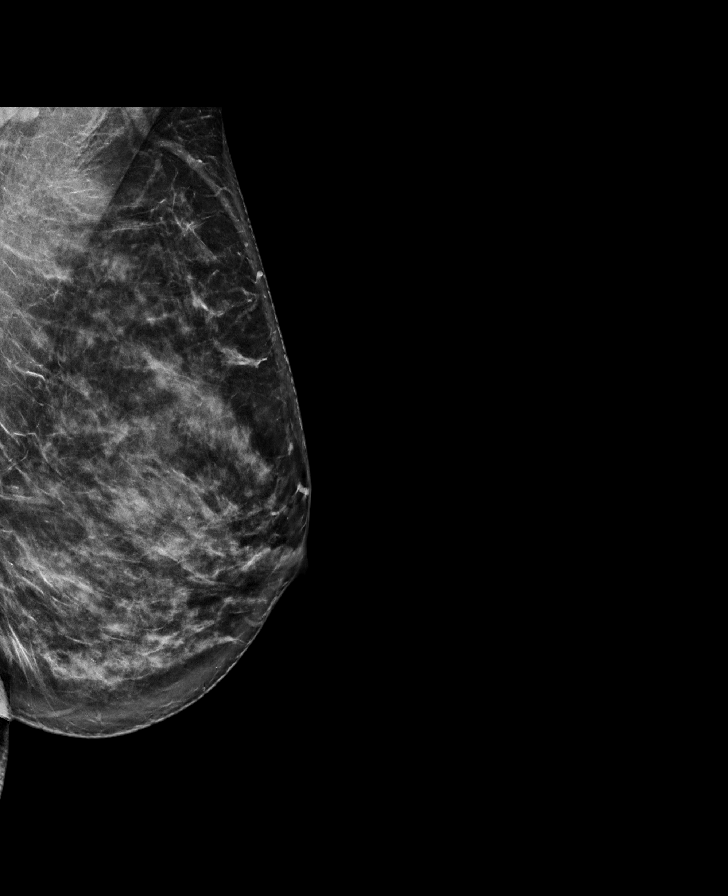

[R CC tomo · tomo slice 42/83.0]
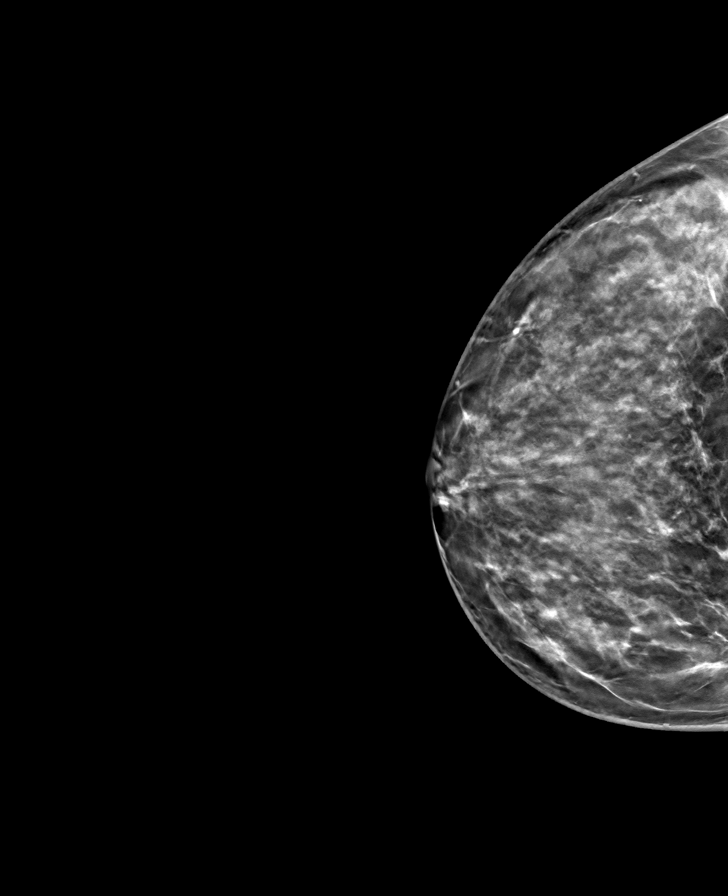

[L CC tomo · tomo slice 38/75.0]
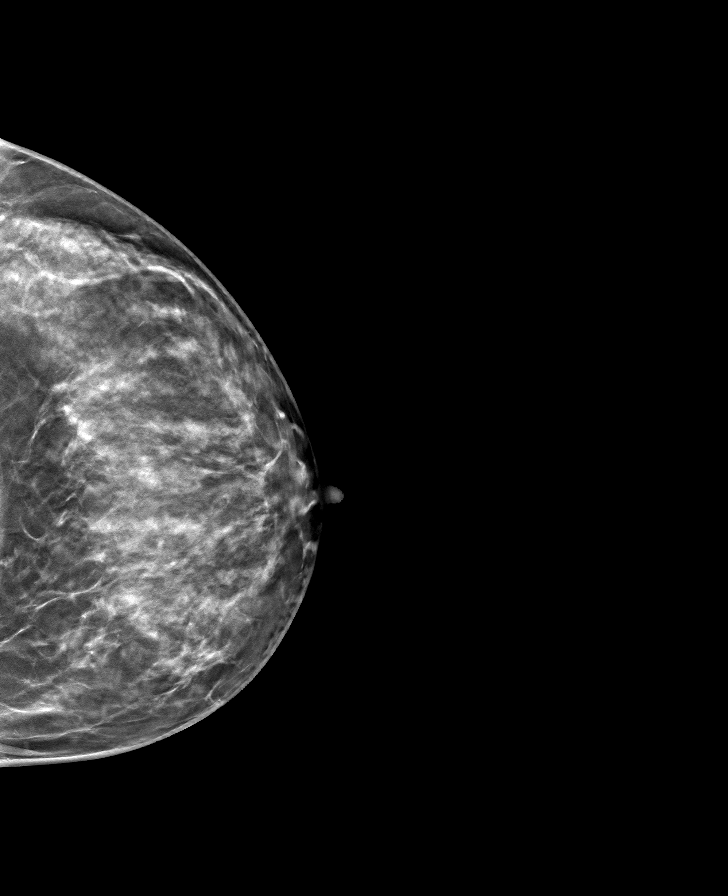

[R MLO tomo · tomo slice 40/79.0]
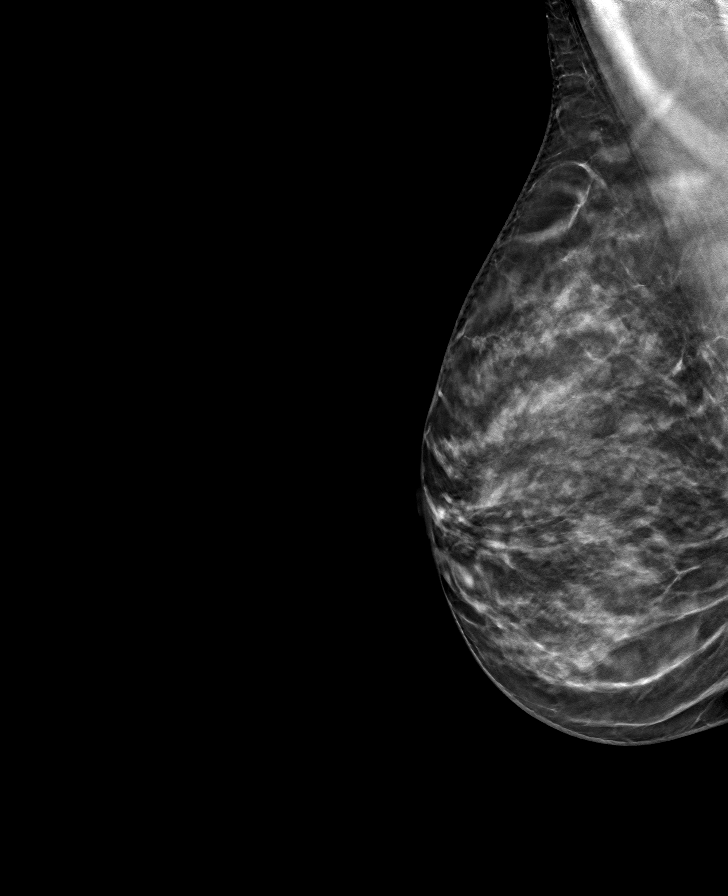

[L MLO tomo · tomo slice 41/80.0]
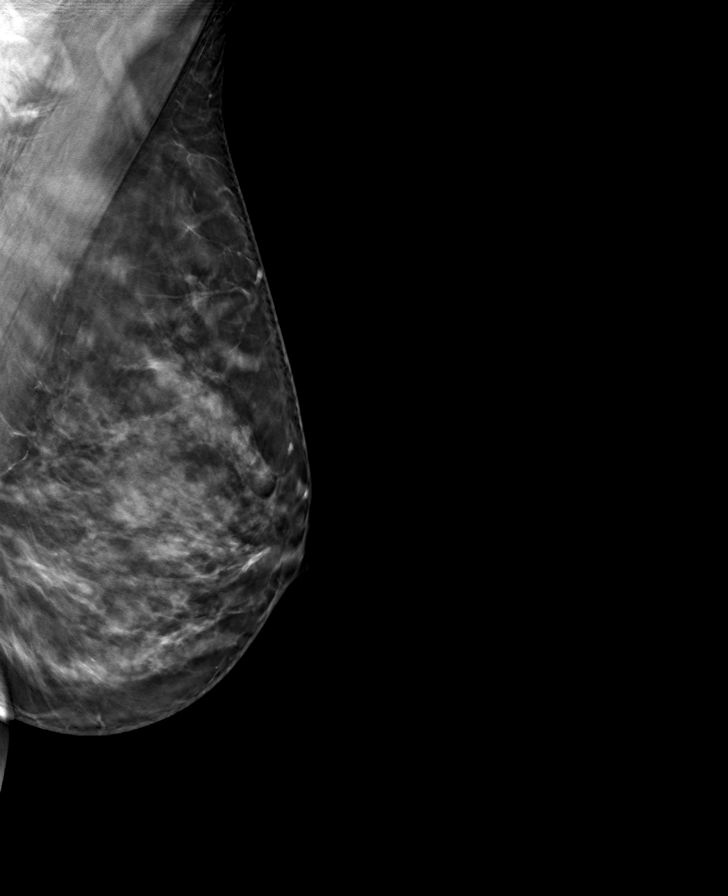

[8 of 24 positions shown; findings below may reference images not displayed]

ACR Breast Density Category c: The breast tissue is heterogeneously
dense, which may obscure small masses.
FINDINGS: In the left breast, a possible asymmetry warrants further
evaluation. In the right breast, no findings suspicious for
malignancy.
IMPRESSION: Further evaluation is suggested for possible asymmetry in the left
breast.

RECOMMENDATION:
Diagnostic mammogram and possibly ultrasound of the left breast.
(Code:8C-V-11X)

The patient will be contacted regarding the findings, and additional
imaging will be scheduled.

BI-RADS CATEGORY  0: Incomplete. Need additional imaging evaluation
and/or prior mammograms for comparison.

## 2022-04-07 ENCOUNTER — Other Ambulatory Visit: Payer: Self-pay | Admitting: Internal Medicine

## 2022-04-07 DIAGNOSIS — G43011 Migraine without aura, intractable, with status migrainosus: Secondary | ICD-10-CM

## 2022-04-27 ENCOUNTER — Other Ambulatory Visit: Payer: Self-pay | Admitting: Internal Medicine

## 2022-04-27 DIAGNOSIS — F325 Major depressive disorder, single episode, in full remission: Secondary | ICD-10-CM

## 2022-05-23 ENCOUNTER — Other Ambulatory Visit: Payer: Self-pay | Admitting: Nurse Practitioner

## 2022-05-23 DIAGNOSIS — N6489 Other specified disorders of breast: Secondary | ICD-10-CM

## 2022-05-30 ENCOUNTER — Ambulatory Visit: Payer: Managed Care, Other (non HMO)

## 2022-05-30 ENCOUNTER — Ambulatory Visit
Admission: RE | Admit: 2022-05-30 | Discharge: 2022-05-30 | Disposition: A | Discharge status: Home / Self Care | Payer: Managed Care, Other (non HMO) | Source: Ambulatory Visit | Attending: Nurse Practitioner | Admitting: Nurse Practitioner

## 2022-05-30 DIAGNOSIS — N6489 Other specified disorders of breast: Secondary | ICD-10-CM

## 2022-09-09 ENCOUNTER — Ambulatory Visit (INDEPENDENT_AMBULATORY_CARE_PROVIDER_SITE_OTHER): Payer: Managed Care, Other (non HMO) | Admitting: Obstetrics & Gynecology

## 2022-09-09 ENCOUNTER — Encounter: Payer: Self-pay | Admitting: Obstetrics & Gynecology

## 2022-09-09 VITALS — BP 116/76 | HR 59

## 2022-09-09 DIAGNOSIS — Z113 Encounter for screening for infections with a predominantly sexual mode of transmission: Secondary | ICD-10-CM

## 2022-09-09 DIAGNOSIS — N760 Acute vaginitis: Secondary | ICD-10-CM | POA: Diagnosis not present

## 2022-09-09 DIAGNOSIS — N898 Other specified noninflammatory disorders of vagina: Secondary | ICD-10-CM

## 2022-09-09 LAB — WET PREP FOR TRICH, YEAST, CLUE

## 2022-09-09 MED ORDER — TINIDAZOLE 500 MG PO TABS: 1000.0000 mg | ORAL_TABLET | Freq: Two times a day (BID) | ORAL | 0 refills | Status: AC

## 2022-09-09 NOTE — Progress Notes (Signed)
    Paula Crosby Oct 18, 1980 [id number]        42 y.o.  [id number] Legally separated.  RP: Vaginal discharge x 3 days  HPI: C/O a vaginal discharge x 3 days with some odor and itching.  Sexually active. On Mirena IUD. Declines full STI screen. No pelvic pain.  No fever.   OB History  Gravida Para Term Preterm AB Living  3 2     1 2   SAB IAB Ectopic Multiple Live Births  1            # Outcome Date GA Lbr Len/2nd Weight Sex Delivery Anes PTL Lv  3 SAB           2 Para           1 Para             Past medical history,surgical history, problem list, medications, allergies, family history and social history were all reviewed and documented in the EPIC chart.   Directed ROS with pertinent positives and negatives documented in the history of present illness/assessment and plan.  Exam:  Vitals:   09/09/22 1207  BP: 116/76  Pulse: (!) 59  SpO2: 99%   General appearance:  Normal  Abdomen: Normal  Gynecologic exam: Vulva normal.  Speculum:  Cervix/Vagina normal.  Increased vaginal discharge, yellowish with bubbles.  Wet prep:  Clue cells present with odor.  WBC and Bacteria TNTC.   Assessment/Plan:  42 y.o. [id number]   1. Vaginal discharge C/O a vaginal discharge x 3 days with some odor and itching.  Sexually active. On Mirena IUD. Declines full STI screen. No pelvic pain.  No fever. Wet prep confirmed BV.  Will treat with Tinidazole.  Usage reviewed, prescription sent to pharmacy. - WET PREP FOR TRICH, YEAST, CLUE - SureSwab Advanced Vaginitis Plus,TMA  2. Screen for STD (sexually transmitted disease) R/O Gono-Chlam with SureSwab Advanced Plus. - SureSwab Advanced Vaginitis Plus,TMA  Other orders - tinidazole (TINDAMAX) 500 MG tablet; Take 2 tablets (1,000 mg total) by mouth 2 (two) times daily for 2 days.   Genia Del MD, 12:14 PM 09/09/2022

## 2022-09-10 LAB — SURESWAB® ADVANCED VAGINITIS PLUS,TMA
C. trachomatis RNA, TMA: NOT DETECTED
CANDIDA SPECIES: NOT DETECTED
Candida glabrata: NOT DETECTED
N. gonorrhoeae RNA, TMA: NOT DETECTED
SURESWAB(R) ADV BACTERIAL VAGINOSIS(BV),TMA: POSITIVE — AB
TRICHOMONAS VAGINALIS (TV),TMA: NOT DETECTED

## 2022-11-06 ENCOUNTER — Encounter: Payer: Self-pay | Admitting: Nurse Practitioner

## 2022-11-06 ENCOUNTER — Ambulatory Visit (INDEPENDENT_AMBULATORY_CARE_PROVIDER_SITE_OTHER): Payer: Managed Care, Other (non HMO) | Admitting: Nurse Practitioner

## 2022-11-06 ENCOUNTER — Other Ambulatory Visit (HOSPITAL_COMMUNITY)
Admission: RE | Admit: 2022-11-06 | Discharge: 2022-11-06 | Disposition: A | Discharge status: Home / Self Care | Payer: Managed Care, Other (non HMO) | Source: Ambulatory Visit | Attending: Nurse Practitioner | Admitting: Nurse Practitioner

## 2022-11-06 VITALS — BP 110/64 | HR 78 | Ht 64.0 in | Wt 151.8 lb

## 2022-11-06 DIAGNOSIS — Z01419 Encounter for gynecological examination (general) (routine) without abnormal findings: Secondary | ICD-10-CM | POA: Diagnosis not present

## 2022-11-06 DIAGNOSIS — Z124 Encounter for screening for malignant neoplasm of cervix: Secondary | ICD-10-CM

## 2022-11-06 DIAGNOSIS — Z30431 Encounter for routine checking of intrauterine contraceptive device: Secondary | ICD-10-CM

## 2022-11-06 DIAGNOSIS — E559 Vitamin D deficiency, unspecified: Secondary | ICD-10-CM | POA: Diagnosis not present

## 2022-11-06 DIAGNOSIS — N951 Menopausal and female climacteric states: Secondary | ICD-10-CM | POA: Diagnosis not present

## 2022-11-06 NOTE — Progress Notes (Signed)
Paula Crosby 1980/07/19 [id number]   History:  42 y.o. [id number] presents for annual exam. Mirena IUD 08/2018, amenorrheic. Complains of night sweats, fatigue and weight gain. Feels she eats healthy and is very active. Walks 2-3 miles per day, does spin class weekly, and a few beach body workouts per week. She reports abnormal pap in 2020 in [place], normal pap in 2021.   Gynecologic History No LMP recorded. (Menstrual status: IUD).   Contraception: IUD Sexually active: Yes  Health Maintenance: Last Pap: 11/01/2019 Results were: Normal, 3-year repeat Last mammogram: 05/30/2022. Results were: Benign fibroglandular tissue in left breast Last colonoscopy: 2018 Last Dexa: Not indicated  Past medical history, past surgical history, family history and social history were all reviewed and documented in the EPIC chart. From [place]. 110 yo and 70 yo sons. Emergency planning/management officer for [employer].  ROS:  A ROS was performed and pertinent positives and negatives are included.  Exam:  Vitals:   11/06/22 0857  BP: 110/64  Pulse: 78  SpO2: 100%  Weight: 151 lb 12.8 oz (68.9 kg)  Height: 5\' 4"  (1.626 m)     Body mass index is 26.06 kg/m.  General appearance:  Normal Thyroid:  Symmetrical, normal in size, without palpable masses or nodularity. Respiratory  Auscultation:  Clear without wheezing or rhonchi Cardiovascular  Auscultation:  Regular rate, without rubs, murmurs or gallops  Edema/varicosities:  Not grossly evident Abdominal  Soft,nontender, without masses, guarding or rebound.  Liver/spleen:  No organomegaly noted  Hernia:  None appreciated  Skin  Inspection:  Grossly normal   Breasts: Examined lying and sitting.   Right: Without masses, retractions, discharge or axillary adenopathy.   Left: Without masses, retractions, discharge or axillary adenopathy. Genitourinary   Inguinal/mons:  Normal without inguinal adenopathy  External genitalia:  Normal appearing vulva with no masses,  tenderness, or lesions  BUS/Urethra/Skene's glands:  Normal  Vagina:  Normal appearing with normal color and discharge, no lesions  Cervix:  Normal appearing without discharge or lesions. IUD string visible  Uterus:  Normal in size, shape and contour.  Midline and mobile, nontender  Adnexa/parametria:     Rt: Normal in size, without masses or tenderness.   Lt: Normal in size, without masses or tenderness.  Anus and perineum: Normal  Digital rectal exam: Not indicated  Patient informed chaperone available to be present for breast and pelvic exam. Patient has requested no chaperone to be present. Patient has been advised what will be completed during breast and pelvic exam.   Assessment/Plan:  42 y.o. [id number] for annual exam.   Well female exam with routine gynecological exam - Plan: CBC with Differential/Platelet, Comprehensive metabolic panel, Lipid panel. Education provided on SBEs, importance of preventative screenings, current guidelines, high calcium diet, regular exercise, and multivitamin daily.   Vitamin D deficiency - Plan: VITAMIN D 25 Hydroxy (Vit-D Deficiency, Fractures)  Encounter for routine checking of intrauterine contraceptive device (IUD) - Mirena inserted 08/2018, amenorrheic. Normal exam. Aware of 8-year FDA approval.   Screening for cervical cancer - Plan: Cytology - PAP( [facility]). Reports abnormal pap in 2020 in [place] confirmed by biopsy, no intervention done. Normal pap 2021.  Perimenopausal vasomotor symptoms - Plan: Follicle stimulating hormone, Estradiol. Supplement list provided. Discussed lifestyle changes, recommend increase in weight training, intermittent fasting/calorie deficit.   Screening for breast cancer -  Stable benign fibroglandular tissue. Continue annual screenings. Normal breast exam today.   Screening for colon cancer - Colonoscopy in 2018.  Follow up in 1  year for annual.      Olivia Mackie [facility], 9:27 AM 11/06/2022

## 2022-11-07 LAB — CYTOLOGY - PAP
Comment: NEGATIVE
Diagnosis: NEGATIVE
High risk HPV: NEGATIVE

## 2022-11-08 ENCOUNTER — Other Ambulatory Visit: Payer: Self-pay | Admitting: Internal Medicine

## 2022-11-08 DIAGNOSIS — F325 Major depressive disorder, single episode, in full remission: Secondary | ICD-10-CM

## 2022-11-13 ENCOUNTER — Other Ambulatory Visit: Payer: Managed Care, Other (non HMO)

## 2022-11-13 ENCOUNTER — Other Ambulatory Visit: Payer: Self-pay | Admitting: *Deleted

## 2022-11-13 DIAGNOSIS — R635 Abnormal weight gain: Secondary | ICD-10-CM

## 2022-11-13 DIAGNOSIS — R5383 Other fatigue: Secondary | ICD-10-CM

## 2022-11-13 NOTE — Patient Instructions (Addendum)
      Medications changes include :   take nurtec as needed

## 2022-11-13 NOTE — Progress Notes (Signed)
      Subjective:    Patient ID: Paula Crosby, female    DOB: [birth year], 42 y.o.   MRN: [id number]     HPI Paula Crosby is here for follow up of her chronic medical problems.  No concerns - needs refills  Medications and allergies reviewed with patient and updated if appropriate.  Current Outpatient Medications on File Prior to Visit  Medication Sig Dispense Refill   levonorgestrel (MIRENA) 20 MCG/DAY IUD 1 each by Intrauterine route once.     melatonin 1 MG TABS tablet Take 3 mg by mouth at bedtime.     No current facility-administered medications on file prior to visit.     Review of Systems  Constitutional:  Negative for fever.  Respiratory:  Negative for cough, shortness of breath and wheezing.   Cardiovascular:  Negative for chest pain, palpitations and leg swelling.  Neurological:  Negative for light-headedness and headaches.       Objective:   Vitals:   11/14/22 1055  BP: 100/68  Pulse: (!) 52  Temp: 98 F (36.7 C)  SpO2: 97%   BP Readings from Last 3 Encounters:  11/14/22 100/68  11/06/22 110/64  09/09/22 116/76   Wt Readings from Last 3 Encounters:  11/14/22 154 lb (69.9 kg)  11/06/22 151 lb 12.8 oz (68.9 kg)  02/10/22 145 lb (65.8 kg)   Body mass index is 26.43 kg/m.    Physical Exam Constitutional:      General: She is not in acute distress.    Appearance: Normal appearance. She is not ill-appearing.  HENT:     Head: Normocephalic and atraumatic.  Skin:    General: Skin is warm and dry.  Neurological:     Mental Status: She is alert. Mental status is at baseline.  Psychiatric:        Mood and Affect: Mood normal.        Behavior: Behavior normal.        Thought Content: Thought content normal.        Judgment: Judgment normal.        Lab Results  Component Value Date   WBC 5.4 11/06/2022   HGB 12.9 11/06/2022   HCT 38.2 11/06/2022   PLT 340 11/06/2022   GLUCOSE 80 11/06/2022   CHOL 154 11/06/2022   TRIG 75 11/06/2022   HDL 60  11/06/2022   LDLCALC 78 11/06/2022   ALT 11 11/06/2022   AST 14 11/06/2022   NA 138 11/06/2022   K 4.2 11/06/2022   CL 105 11/06/2022   CREATININE 0.75 11/06/2022   BUN 11 11/06/2022   CO2 25 11/06/2022   TSH 1.46 11/13/2022     Assessment & Plan:    Anxiety: Chronic Controlled, stable Continue sertraline 100 mg daily  Migraines : Chronic Not taking nurtec every other day because it was difficult to get filled b/c insurance Not taking anything now Not having as frequent of migraines - maybe once a week Advised to take nurtec daily prn New rx sent to pharmacy and she will take as needed If migraines increased discussed there are other options for treatment  Refills sent to pharmacy

## 2022-11-14 ENCOUNTER — Encounter: Payer: Self-pay | Admitting: Internal Medicine

## 2022-11-14 ENCOUNTER — Ambulatory Visit (INDEPENDENT_AMBULATORY_CARE_PROVIDER_SITE_OTHER): Payer: Managed Care, Other (non HMO) | Admitting: Internal Medicine

## 2022-11-14 VITALS — BP 100/68 | HR 52 | Temp 98.0°F | Ht 64.0 in | Wt 154.0 lb

## 2022-11-14 DIAGNOSIS — G43011 Migraine without aura, intractable, with status migrainosus: Secondary | ICD-10-CM

## 2022-11-14 DIAGNOSIS — F419 Anxiety disorder, unspecified: Secondary | ICD-10-CM | POA: Diagnosis not present

## 2022-11-14 LAB — LIPID PANEL
Cholesterol: 154 mg/dL (ref <200)
HDL: 60 mg/dL (ref >=50)
LDL Cholesterol (Calc): 78 mg/dL
Non-HDL Cholesterol (Calc): 94 mg/dL (calc) (ref <130)
Total CHOL/HDL Ratio: 2.6 (calc) (ref <5.0)
Triglycerides: 75 mg/dL (ref <150)

## 2022-11-14 LAB — COMPREHENSIVE METABOLIC PANEL
AG Ratio: 1.6 (calc) (ref 1.0–2.5)
ALT: 11 U/L (ref 6–29)
AST: 14 U/L (ref 10–30)
Albumin: 4.3 g/dL (ref 3.6–5.1)
Alkaline phosphatase (APISO): 46 U/L (ref 31–125)
BUN: 11 mg/dL (ref 7–25)
CO2: 25 mmol/L (ref 20–32)
Calcium: 9.1 mg/dL (ref 8.6–10.2)
Chloride: 105 mmol/L (ref 98–110)
Creat: 0.75 mg/dL (ref 0.50–0.99)
Globulin: 2.7 g/dL (ref 1.9–3.7)
Glucose, Bld: 80 mg/dL (ref 65–99)
Potassium: 4.2 mmol/L (ref 3.5–5.3)
Sodium: 138 mmol/L (ref 135–146)
Total Bilirubin: 0.5 mg/dL (ref 0.2–1.2)
Total Protein: 7 g/dL (ref 6.1–8.1)

## 2022-11-14 LAB — CBC WITH DIFFERENTIAL/PLATELET
Absolute Monocytes: 616 {cells}/uL (ref 200–950)
Basophils Absolute: 70 {cells}/uL (ref 0–200)
Basophils Relative: 1.3 %
Eosinophils Absolute: 81 {cells}/uL (ref 15–500)
Eosinophils Relative: 1.5 %
HCT: 38.2 % (ref 35.0–45.0)
Hemoglobin: 12.9 g/dL (ref 11.7–15.5)
Lymphs Abs: 1804 {cells}/uL (ref 850–3900)
MCH: 31.5 pg (ref 27.0–33.0)
MCHC: 33.8 g/dL (ref 32.0–36.0)
MCV: 93.4 fL (ref 80.0–100.0)
MPV: 9.9 fL (ref 7.5–12.5)
Monocytes Relative: 11.4 %
Neutro Abs: 2830 {cells}/uL (ref 1500–7800)
Neutrophils Relative %: 52.4 %
Platelets: 340 10*3/uL (ref 140–400)
RBC: 4.09 10*6/uL (ref 3.80–5.10)
RDW: 11.6 % (ref 11.0–15.0)
Total Lymphocyte: 33.4 %
WBC: 5.4 10*3/uL (ref 3.8–10.8)

## 2022-11-14 LAB — TSH
TSH: 2.08 m[IU]/L
TSH: 1.46 m[IU]/L

## 2022-11-14 LAB — ESTRADIOL: Estradiol: 51 pg/mL

## 2022-11-14 LAB — FOLLICLE STIMULATING HORMONE: FSH: 6.3 m[IU]/mL

## 2022-11-14 LAB — VITAMIN D 25 HYDROXY (VIT D DEFICIENCY, FRACTURES): Vit D, 25-Hydroxy: 28 ng/mL — ABNORMAL LOW (ref 30–100)

## 2022-11-14 MED ORDER — NURTEC 75 MG PO TBDP
75.0000 mg | ORAL_TABLET | Freq: Every day | ORAL | 5 refills | Status: DC | PRN
Start: 2022-11-14 — End: 2023-04-07

## 2022-11-14 MED ORDER — SERTRALINE HCL 100 MG PO TABS: 100.0000 mg | ORAL_TABLET | Freq: Every day | ORAL | 1 refills | Status: DC

## 2022-12-02 ENCOUNTER — Other Ambulatory Visit (HOSPITAL_COMMUNITY): Payer: Self-pay

## 2022-12-02 ENCOUNTER — Telehealth: Payer: Self-pay

## 2022-12-02 NOTE — Telephone Encounter (Signed)
Pharmacy Patient Advocate Encounter   Received notification from Pt Calls Messages that prior authorization for Nurtec 75mg  is required/requested.   Insurance verification completed.   The patient is insured through [employer] .   Per test claim: PA required; PA submitted to [employer] via CoverMyMeds Key/confirmation #/EOC [id number] Status is pending

## 2022-12-04 NOTE — Telephone Encounter (Signed)
Pharmacy Patient Advocate Encounter  Received notification from [employer] that Prior Authorization for Nurtec 75mg  has been DENIED.  See denial reason below. No denial letter attached in CMM. Will attache denial letter to Media tab once received.   PA #/Case ID/Reference #: [id number]

## 2022-12-31 ENCOUNTER — Encounter: Payer: Self-pay | Admitting: Nurse Practitioner

## 2022-12-31 ENCOUNTER — Ambulatory Visit (INDEPENDENT_AMBULATORY_CARE_PROVIDER_SITE_OTHER): Payer: Managed Care, Other (non HMO) | Admitting: Nurse Practitioner

## 2022-12-31 VITALS — BP 100/76 | HR 68

## 2022-12-31 DIAGNOSIS — N898 Other specified noninflammatory disorders of vagina: Secondary | ICD-10-CM

## 2022-12-31 DIAGNOSIS — N760 Acute vaginitis: Secondary | ICD-10-CM

## 2022-12-31 DIAGNOSIS — B9689 Other specified bacterial agents as the cause of diseases classified elsewhere: Secondary | ICD-10-CM

## 2022-12-31 LAB — WET PREP FOR TRICH, YEAST, CLUE

## 2022-12-31 MED ORDER — METRONIDAZOLE 500 MG PO TABS
500.0000 mg | ORAL_TABLET | Freq: Two times a day (BID) | ORAL | 0 refills | Status: DC
Start: 2022-12-31 — End: 2023-04-15

## 2022-12-31 NOTE — Progress Notes (Signed)
   Acute Office Visit  Subjective:    Patient ID: Paula Crosby, female    DOB: 1980-05-08, 42 y.o.   MRN: [id number]   HPI 42 y.o. presents today for vaginal discharge and odor. Feels she gets BV symptoms occasionally. Taking probiotic. Denies itching or irritation. No changes in soaps.   No LMP recorded. (Menstrual status: IUD).    Review of Systems  Constitutional: Negative.   Genitourinary:  Positive for vaginal discharge. Negative for dysuria, flank pain, frequency, hematuria, pelvic pain, urgency and vaginal pain.       Vaginal odor       Objective:    Physical Exam Constitutional:      Appearance: Normal appearance.  Genitourinary:    General: Normal vulva.     Vagina: Vaginal discharge present. No erythema.     Cervix: Normal.     BP 100/76   Pulse 68   SpO2 99%  Wt Readings from Last 3 Encounters:  11/14/22 154 lb (69.9 kg)  11/06/22 151 lb 12.8 oz (68.9 kg)  02/10/22 145 lb (65.8 kg)        Patient informed chaperone available to be present for breast and/or pelvic exam. Patient has requested no chaperone to be present. Patient has been advised what will be completed during breast and pelvic exam.   Wet prep + clue cells (+ odor)  Assessment & Plan:   Problem List Items Addressed This Visit   None Visit Diagnoses     Bacterial vaginosis    -  Primary   Relevant Medications   metroNIDAZOLE (FLAGYL) 500 MG tablet   Vaginal discharge       Relevant Orders   WET PREP FOR TRICH, YEAST, CLUE       Plan: Wet prep positive for clue cells - Flagyl 500 mg BID x 7 days. Continue probiotic. Avoid soaps with fragrance or harsh chemicals. Boric acid vaginal suppositories twice weekly or daily x 10 days with symptoms.   Return if symptoms worsen or fail to improve.    Olivia Mackie DNP, 10:06 AM 12/31/2022

## 2023-03-31 ENCOUNTER — Encounter: Payer: Self-pay | Admitting: Emergency Medicine

## 2023-03-31 ENCOUNTER — Ambulatory Visit
Admission: EM | Admit: 2023-03-31 | Discharge: 2023-03-31 | Disposition: A | Discharge status: Other Acute Inpatient Hospital | Payer: Managed Care, Other (non HMO)

## 2023-03-31 DIAGNOSIS — M79645 Pain in left finger(s): Secondary | ICD-10-CM | POA: Diagnosis not present

## 2023-03-31 DIAGNOSIS — L819 Disorder of pigmentation, unspecified: Secondary | ICD-10-CM | POA: Diagnosis not present

## 2023-03-31 NOTE — ED Notes (Signed)
 Patient is being discharged from the Urgent Care and sent to the Emergency Department via POV . Per JD, patient is in need of higher level of care due to finger discoloration and pain without known cause. Patient is aware and verbalizes understanding of plan of care.  Vitals:   03/31/23 1746  BP: 111/73  Pulse: 68  Resp: 18  Temp: 98 F (36.7 C)  SpO2: 96%

## 2023-03-31 NOTE — Discharge Instructions (Addendum)
 Please have left dominant hand(pinky) evaluated for vascular or orthopedic injury. We are unable to xray or provide vascular US study in office today. Please go to ER for further evaluation

## 2023-03-31 NOTE — ED Provider Notes (Signed)
 [facility]    CSN: [id number] Arrival date & time: 03/31/23  1709      History   Chief Complaint Chief Complaint  Patient presents with   Hand Pain    HPI Paula Crosby is a 43 y.o. female.   43 year old female, Paula Crosby, presents to urgent care for evaluation of left pinky finger.  Patient states she is concerned about blood flow to left pinky finger.  Patient states she noticed her left pinky finger was sore and cold to touch yesterday, got worse today, patient denies any injury or trauma. Pt reports numbness and tingling to left pinky finger.  Patient is left-hand dominant  The history is provided by the patient. No language interpreter was used.    Past Medical History:  Diagnosis Date   Anxiety    HPV (human papilloma virus) infection    Migraines     Patient Active Problem List   Diagnosis Date Noted   Discoloration of skin of finger 03/31/2023   Finger pain, left 03/31/2023   Encounter for general adult medical examination with abnormal findings 05/21/2020   Intractable migraine without aura and with status migrainosus 05/16/2020   Glaucoma of both eyes 05/16/2020   Acquired hallux valgus of left foot 12/17/2018   Bunion of great toe of left foot 12/17/2018   Bunion 10/05/2018   Hallux valgus 10/05/2018   Left foot pain 10/05/2018   Metatarsalgia 10/05/2018   Sesamoiditis 10/05/2018    Past Surgical History:  Procedure Laterality Date   COLONOSCOPY     polyps in small intestine removed    COLPOSCOPY  2020   DILATION AND CURETTAGE OF UTERUS      OB History     Gravida  3   Para  2   Term      Preterm      AB  1   Living  2      SAB  1   IAB      Ectopic      Multiple      Live Births               Home Medications    Prior to Admission medications   Medication Sig Start Date End Date Taking? Authorizing Provider  levonorgestrel (MIRENA) 20 MCG/DAY IUD 1 each by Intrauterine route once.   Yes [provider]  melatonin 1 MG TABS tablet Take 3 mg by mouth at bedtime.   Yes [provider]  sertraline  (ZOLOFT ) 100 MG tablet Take 1 tablet (100 mg total) by mouth daily. 11/14/22  Yes Burns, Glade PARAS, MD  metroNIDAZOLE  (FLAGYL ) 500 MG tablet Take 1 tablet (500 mg total) by mouth 2 (two) times daily. 12/31/22   Prentiss Annabella LABOR, NP  OVER THE COUNTER MEDICATION [place] OTC Supplement for Peri Menopause    [provider]  Rimegepant Sulfate (NURTEC) 75 MG TBDP Take 1 tablet (75 mg total) by mouth daily as needed. 11/14/22   Geofm Glade PARAS, MD    Family History Family History  Problem Relation Age of Onset   Heart disease Maternal Grandfather    Congestive Heart Failure Maternal Grandfather    Diabetes Maternal Grandfather    Hypertension Maternal Grandfather    Alcohol abuse Mother    Depression Mother    Migraines Mother    Alcohol abuse Brother    Depression Brother    Hypertension Brother    Migraines Brother  Social History Social History   Tobacco Use   Smoking status: Never    Passive exposure: Current   Smokeless tobacco: Never  Vaping Use   Vaping status: Never Used  Substance Use Topics   Alcohol use: Yes    Alcohol/week: 1.0 standard drink of alcohol    Types: 1 Glasses of wine per week    Comment: RARE   Drug use: Never     Allergies   Patient has no known allergies.   Review of Systems Review of Systems  Constitutional:  Negative for fever.  Musculoskeletal:  Positive for arthralgias and myalgias.  Skin:  Positive for color change.  All other systems reviewed and are negative.    Physical Exam Triage Vital Signs ED Triage Vitals  Encounter Vitals Group     BP 03/31/23 1746 111/73     Systolic BP Percentile --      Diastolic BP Percentile --      Pulse Rate 03/31/23 1746 68     Resp 03/31/23 1746 18     Temp 03/31/23 1746 98 F (36.7 C)     Temp Source 03/31/23 1746 Oral     SpO2 03/31/23 1746 96 %     Weight  03/31/23 1743 154 lb 3.7 oz (70 kg)     Height 03/31/23 1743 5' 4 (1.626 m)     Head Circumference --      Peak Flow --      Pain Score 03/31/23 1742 2     Pain Loc --      Pain Education --      Exclude from Growth Chart --    No data found.  Updated Vital Signs BP 111/73 (BP Location: Left Arm)   Pulse 68   Temp 98 F (36.7 C) (Oral)   Resp 18   Ht 5' 4 (1.626 m)   Wt 154 lb 3.7 oz (70 kg)   LMP  (LMP Unknown)   SpO2 96%   BMI 26.47 kg/m   Visual Acuity Right Eye Distance:   Left Eye Distance:   Bilateral Distance:    Right Eye Near:   Left Eye Near:    Bilateral Near:     Physical Exam Vitals and nursing note reviewed.  Constitutional:      Appearance: Normal appearance.  HENT:     Head: Normocephalic.  Cardiovascular:     Rate and Rhythm: Normal rate and regular rhythm.     Pulses:          Radial pulses are 2+ on the left side.  Pulmonary:     Effort: Pulmonary effort is normal.  Neurological:     Mental Status: She is alert and oriented to person, place, and time.     GCS: GCS eye subscore is 4. GCS verbal subscore is 5. GCS motor subscore is 6.     Comments: Patient states her left pinky finger feels numb and cool to touch, left pinky finger is discolored(ecchymotic) from MIP to DIP, patient has full range of motion  Psychiatric:        Attention and Perception: Attention normal.        Mood and Affect: Mood normal.        Speech: Speech normal.        Behavior: Behavior is cooperative.      UC Treatments / Results  Labs (all labs ordered are listed, but only abnormal results are displayed) Labs Reviewed - No data to display  EKG   Radiology No results found.  Procedures Procedures (including critical care time)  Medications Ordered in UC Medications - No data to display  Initial Impression / Assessment and Plan / UC Course  I have reviewed the triage vital signs and the nursing notes.  Pertinent labs & imaging results that were  available during my care of the patient were reviewed by me and considered in my medical decision making (see chart for details).    Pt wants to verify no vascular issue, unable to perform in office. Pt denies injury. Referred to ER for further evaluation (vascular evaluation)  Ddx: Left pinky finger pain, vascular issue, Raynaud, contusion Final Clinical Impressions(s) / UC Diagnoses   Final diagnoses:  Discoloration of skin of finger  Finger pain, left     Discharge Instructions      Please have left dominant hand(pinky) evaluated for vascular or orthopedic injury. We are unable to xray or provide vascular US  study in office today. Please go to ER for further evaluation     ED Prescriptions   None    PDMP not reviewed this encounter.   Aminta Loose, NP 03/31/23 1926

## 2023-03-31 NOTE — ED Triage Notes (Signed)
 Pt is concerned about blood flow to left hand pinky finger. Finger is sore and cold to touch. Bruising keep spreading. First noticed bruising yesterday around 6 pm. Noticed pain during the work day yesterday.

## 2023-04-01 ENCOUNTER — Other Ambulatory Visit: Payer: Self-pay

## 2023-04-01 ENCOUNTER — Ambulatory Visit: Payer: Self-pay

## 2023-04-01 ENCOUNTER — Emergency Department (HOSPITAL_BASED_OUTPATIENT_CLINIC_OR_DEPARTMENT_OTHER): Payer: Managed Care, Other (non HMO)

## 2023-04-01 ENCOUNTER — Encounter (HOSPITAL_BASED_OUTPATIENT_CLINIC_OR_DEPARTMENT_OTHER): Payer: Self-pay | Admitting: *Deleted

## 2023-04-01 ENCOUNTER — Emergency Department (HOSPITAL_BASED_OUTPATIENT_CLINIC_OR_DEPARTMENT_OTHER)
Admission: EM | Admit: 2023-04-01 | Discharge: 2023-04-02 | Disposition: A | Discharge status: Home / Self Care | Payer: Managed Care, Other (non HMO) | Attending: Emergency Medicine | Admitting: Emergency Medicine

## 2023-04-01 DIAGNOSIS — L819 Disorder of pigmentation, unspecified: Secondary | ICD-10-CM | POA: Insufficient documentation

## 2023-04-01 DIAGNOSIS — R002 Palpitations: Secondary | ICD-10-CM

## 2023-04-01 LAB — CBC WITH DIFFERENTIAL/PLATELET
Abs Immature Granulocytes: 0.01 10*3/uL (ref 0.00–0.07)
Basophils Absolute: 0.1 10*3/uL (ref 0.0–0.1)
Basophils Relative: 1 %
Eosinophils Absolute: 0.1 10*3/uL (ref 0.0–0.5)
Eosinophils Relative: 2 %
HCT: 37 % (ref 36.0–46.0)
Hemoglobin: 12.7 g/dL (ref 12.0–15.0)
Immature Granulocytes: 0 %
Lymphocytes Relative: 39 %
Lymphs Abs: 2.7 10*3/uL (ref 0.7–4.0)
MCH: 31.8 pg (ref 26.0–34.0)
MCHC: 34.3 g/dL (ref 30.0–36.0)
MCV: 92.7 fL (ref 80.0–100.0)
Monocytes Absolute: 0.7 10*3/uL (ref 0.1–1.0)
Monocytes Relative: 11 %
Neutro Abs: 3.3 10*3/uL (ref 1.7–7.7)
Neutrophils Relative %: 47 %
Platelets: 336 10*3/uL (ref 150–400)
RBC: 3.99 MIL/uL (ref 3.87–5.11)
RDW: 11.9 % (ref 11.5–15.5)
WBC: 6.9 10*3/uL (ref 4.0–10.5)
nRBC: 0 % (ref 0.0–0.2)

## 2023-04-01 LAB — BASIC METABOLIC PANEL
Anion gap: 9 (ref 5–15)
BUN: 10 mg/dL (ref 6–20)
CO2: 25 mmol/L (ref 22–32)
Calcium: 9.5 mg/dL (ref 8.9–10.3)
Chloride: 105 mmol/L (ref 98–111)
Creatinine, Ser: 0.76 mg/dL (ref 0.44–1.00)
GFR, Estimated: >60 mL/min (ref >60)
Glucose, Bld: 89 mg/dL (ref 70–99)
Potassium: 3.7 mmol/L (ref 3.5–5.1)
Sodium: 139 mmol/L (ref 135–145)

## 2023-04-01 LAB — HCG, SERUM, QUALITATIVE: Preg, Serum: NEGATIVE

## 2023-04-01 MED ORDER — ASPIRIN 81 MG PO TBEC: 81.0000 mg | DELAYED_RELEASE_TABLET | Freq: Every day | ORAL | 0 refills | Status: AC

## 2023-04-01 MED ORDER — IOHEXOL 350 MG/ML SOLN
150.0000 mL | Freq: Once | INTRAVENOUS | Status: AC | PRN
  Administered 2023-04-01: 120 mL via INTRAVENOUS

## 2023-04-01 NOTE — ED Provider Notes (Signed)
[name] [facility] AT [facility] Provider Note   CSN: [id number] Arrival date & time: 04/01/23  1506     History  Chief Complaint  Patient presents with  . Hand Pain    Paula Crosby is a 43 y.o. female with a past medical history significant for anxiety and history of migraines who presents to the ED due to discoloration of the left fifth finger.  Patient states on Monday she was sitting at a meeting and developed shooting pain throughout fingers 3 through 5.  She then noticed her left fifth finger began to feel numb and tingly.  She then notes her finger turned purple in color and cooler than the rest of the fingers.  She notes discoloration has began to spread distally throughout her left fifth finger. No known injury. No history of blood clots. No history of Raynaud's syndrome.  Patient does admit to intermittent palpitations that occur daily.  No history of A-fib or abnormal heart rhythms.  History obtained from patient and past medical records. No interpreter used during encounter.       Home Medications Prior to Admission medications   Medication Sig Start Date End Date Taking? Authorizing Provider  aspirin EC 81 MG tablet Take 1 tablet (81 mg total) by mouth daily. Swallow whole. 04/01/23 05/01/23 Yes Zariyah Stephens, Merla Riches, PA-C  levonorgestrel (MIRENA) 20 MCG/DAY IUD 1 each by Intrauterine route once.    [provider]  melatonin 1 MG TABS tablet Take 3 mg by mouth at bedtime.    [provider]  metroNIDAZOLE (FLAGYL) 500 MG tablet Take 1 tablet (500 mg total) by mouth 2 (two) times daily. 12/31/22   Olivia Mackie, NP  OVER THE COUNTER MEDICATION [place] OTC Supplement for Peri Menopause    [provider]  Rimegepant Sulfate (NURTEC) 75 MG TBDP Take 1 tablet (75 mg total) by mouth daily as needed. 11/14/22   Pincus Sanes, MD  sertraline (ZOLOFT) 100 MG tablet Take 1 tablet (100 mg total) by mouth daily. 11/14/22    Pincus Sanes, MD      Allergies    Patient has no known allergies.    Review of Systems   Review of Systems  Skin:  Positive for color change.  Neurological:  Positive for numbness.    Physical Exam Updated Vital Signs BP 109/83 (BP Location: Right Arm)   Pulse 81   Temp 99.1 F (37.3 C) (Oral)   Resp 16   LMP  (LMP Unknown)   SpO2 98%  Physical Exam Vitals and nursing note reviewed.  Constitutional:      General: She is not in acute distress.    Appearance: She is not ill-appearing.  HENT:     Head: Normocephalic.  Eyes:     Pupils: Pupils are equal, round, and reactive to light.  Cardiovascular:     Rate and Rhythm: Normal rate and regular rhythm.     Pulses: Normal pulses.     Heart sounds: Normal heart sounds. No murmur heard.    No friction rub. No gallop.  Pulmonary:     Effort: Pulmonary effort is normal.     Breath sounds: Normal breath sounds.  Abdominal:     General: Abdomen is flat. There is no distension.     Palpations: Abdomen is soft.     Tenderness: There is no abdominal tenderness. There is no guarding or rebound.  Musculoskeletal:        General: Normal range  of motion.     Cervical back: Neck supple.     Comments: Discolored left fifth finger.  Full range of motion.  Left fifth finger cooler than all other fingers.  Radial pulse intact. See photos below.   Skin:    General: Skin is warm and dry.  Neurological:     General: No focal deficit present.     Mental Status: She is alert.  Psychiatric:        Mood and Affect: Mood normal.        Behavior: Behavior normal.        ED Results / Procedures / Treatments   Labs (all labs ordered are listed, but only abnormal results are displayed) Labs Reviewed  CBC WITH DIFFERENTIAL/PLATELET  BASIC METABOLIC PANEL  HCG, SERUM, QUALITATIVE  LIPID PANEL  ANTITHROMBIN III  PROTEIN C ACTIVITY  PROTEIN C, TOTAL  PROTEIN S ACTIVITY  PROTEIN S, TOTAL  LUPUS ANTICOAGULANT PANEL   BETA-2-GLYCOPROTEIN I ABS, IGG/M/A  HOMOCYSTEINE  FACTOR 5 LEIDEN  PROTHROMBIN GENE MUTATION  CARDIOLIPIN ANTIBODIES, IGG, IGM, IGA    EKG None  Radiology DG Finger Little Left Result Date: 04/01/2023 CLINICAL DATA:  Discoloration EXAM: LEFT FINGER(S) - 2+ VIEW COMPARISON:  None Available. FINDINGS: There is no evidence of fracture or dislocation. There is no evidence of arthropathy or other focal bone abnormality. Soft tissues are unremarkable. IMPRESSION: Negative radiographs of the finger. Electronically Signed   By: Narda Rutherford M.D.   On: 04/01/2023 20:41    Procedures Procedures    Medications Ordered in ED Medications - No data to display  ED Course/ Medical Decision Making/ A&P                                  Medical Decision Making Amount and/or Complexity of Data Reviewed Labs: ordered. Radiology: ordered.   This patient presents to the ED for concern of finger discoloration, this involves an extensive number of treatment options, and is a complaint that carries with it a high risk of complications and morbidity.  The differential diagnosis includes arterial occlusion, Raynaud's, bony fracture, etc  43 year old female presents to the ED due to discoloration of left fifth finger x 2 days.  Patient also states her left fifth finger is cooler than the rest of the fingers.  Patient does admit to intermittent palpitations that occur daily.  No history of A-fib or abnormal rhythms.  No known injury.  Upon arrival, vitals all within normal limits.  Patient in no acute distress.  Discoloration to left fifth finger see photos above.  Left fifth finger cooler than all other fingers.  Radial pulse intact.  Full range of motion of left fifth finger.  X-ray ordered to rule out bony fracture however, my suspicion is low given no history of trauma.  Concern for some thrombotic event? Will discuss with vascular surgery for further recommendations.  8:35 PM Discussed with Dr.  Myra Gianotti with vascular who recommends obtaining CTA chest arota and CTA upper extremity.  Recommends obtaining hypercoagulable panel and cholesterol panel. He recommends starting patient on ASA 81mg  and crestor 10mg  (if LDL elevated).   X-ray personally reviewed and interpreted which is negative for any bony fractures.  Patient handed off to [facility], PA-C at shift change pending CT images and labs. If unremarkable, patient can be discharged with PCP follow-up and cardiology follow-up for palpitations. ASA 81mg  and Crestor 10mg  (if LDL elevated)  Co morbidities that complicate the patient evaluation  Anxiety, hx migraines Cardiac Monitoring: / EKG:  The patient was maintained on a cardiac monitor.  I personally viewed and interpreted the cardiac monitored which showed an underlying rhythm of: NSR  Social Determinants of Health:  Has PCP on file         Final Clinical Impression(s) / ED Diagnoses Final diagnoses:  Discoloration of skin of finger    Rx / DC Orders ED Discharge Orders          Ordered    aspirin EC 81 MG tablet  Daily        04/01/23 2142              Jesusita Oka 04/01/23 2144    Rondel Baton, MD 04/03/23 [phone]

## 2023-04-01 NOTE — ED Triage Notes (Signed)
 Pt has a purple right pinky finger.  Pt was seen at [facility] for this today.  Symptoms began Monday evening.  Pt denies any trauma.  Pt was sitting at rest and felt 3 pains in last 3 fingers and after that she noted that her right pinky finger was cool and purple, initially there was some swelling.  Pinky is warm to touch but I can see the purple, pt states that this is spreading.  [facility] advised her to come here as they were not able to get her in to see a vascular specialist for US  today. Slight delay in cap refill (about 4sec).  Pt describes a tingling.

## 2023-04-01 NOTE — ED Provider Notes (Signed)
Physical Exam  BP 110/72 (BP Location: Left Arm)   Pulse 60   Temp 99.1 F (37.3 C) (Oral)   Resp 18   LMP  (LMP Unknown)   SpO2 98%   Physical Exam Vitals and nursing note reviewed.  Constitutional:      General: She is not in acute distress.    Appearance: She is not ill-appearing or toxic-appearing.  Eyes:     General: No scleral icterus. Pulmonary:     Effort: Pulmonary effort is normal. No respiratory distress.  Musculoskeletal:     Comments: Discoloration seen the the middle and distal tip of the left fifth finger. Cap refill ~2 seconds. Sensation intact throughout the fingers. No significantly swelling. Compartments are soft. Palpable pulse. Full ROM.   Skin:    General: Skin is warm and dry.  Neurological:     Mental Status: She is alert.     Procedures  Procedures  ED Course / MDM    Medical Decision Making Amount and/or Complexity of Data Reviewed Labs: ordered. Radiology: ordered.  Risk OTC drugs. Prescription drug management.   Accepted handoff at shift change from [facility]. Please see prior provider note for more detail.   Briefly: Patient is 43 y.o. F reportedly otherwise healthy that presents with discoloration to her left little finger for the past few days. Vascular has been consulted. Images ordered.   DDX: concern for occlusion  Plan: follow up on images.   CT Angio ext shows Vascular patency of the left upper extremity to the level of the palmar arch. Per radiologist's interpretation.    CT Angio chest shows  Negative for dissection or aneurysm. Negative contrast-enhanced chest CT.  Patient has other labs that will not result tonight, or likely even tomorrow. Given the reassuring workup, she can be discharged home with close follow up. I have sent in a prescription for the Crestor in case her LDL is elevated. She know to consult her PCP to review her labs. She is aware of the ASA. Information for vascular and cardiology given. She will  need to follow up.   The patient reports that since being here in the warmth with the war blankets, she feels like the color may be improving slightly.   We discussed the results of the labs/imaging. The plan is follow up with specialist, take medications, return if new or worsening. We discussed strict return precautions and red flag symptoms. The patient verbalized their understanding and agrees to the plan. The patient is stable and being discharged home in good condition.   Results for orders placed or performed during the hospital encounter of 04/01/23  CBC with Differential   Collection Time: 04/01/23  9:14 PM  Result Value Ref Range   WBC 6.9 4.0 - 10.5 K/uL   RBC 3.99 3.87 - 5.11 MIL/uL   Hemoglobin 12.7 12.0 - 15.0 g/dL   HCT 65.7 84.6 - 96.2 %   MCV 92.7 80.0 - 100.0 fL   MCH 31.8 26.0 - 34.0 pg   MCHC 34.3 30.0 - 36.0 g/dL   RDW 95.2 84.1 - 32.4 %   Platelets 336 150 - 400 K/uL   nRBC 0.0 0.0 - 0.2 %   Neutrophils Relative % 47 %   Neutro Abs 3.3 1.7 - 7.7 K/uL   Lymphocytes Relative 39 %   Lymphs Abs 2.7 0.7 - 4.0 K/uL   Monocytes Relative 11 %   Monocytes Absolute 0.7 0.1 - 1.0 K/uL   Eosinophils Relative 2 %  Eosinophils Absolute 0.1 0.0 - 0.5 K/uL   Basophils Relative 1 %   Basophils Absolute 0.1 0.0 - 0.1 K/uL   Immature Granulocytes 0 %   Abs Immature Granulocytes 0.01 0.00 - 0.07 K/uL  Basic metabolic panel   Collection Time: 04/01/23  9:14 PM  Result Value Ref Range   Sodium 139 135 - 145 mmol/L   Potassium 3.7 3.5 - 5.1 mmol/L   Chloride 105 98 - 111 mmol/L   CO2 25 22 - 32 mmol/L   Glucose, Bld 89 70 - 99 mg/dL   BUN 10 6 - 20 mg/dL   Creatinine, Ser 0.96 0.44 - 1.00 mg/dL   Calcium 9.5 8.9 - 04.5 mg/dL   GFR, Estimated >40 >98 mL/min   Anion gap 9 5 - 15  hCG, serum, qualitative   Collection Time: 04/01/23  9:14 PM  Result Value Ref Range   Preg, Serum NEGATIVE NEGATIVE   CT ANGIO UP EXTREM LEFT W &/OR WO CONTAST Result Date:  04/01/2023 CLINICAL DATA:  Finger ischemia EXAM: CT ANGIOGRAPHY OF THE left upperEXTREMITY TECHNIQUE: Multidetector CT imaging of the left upperwas performed using the standard protocol during bolus administration of intravenous contrast. Multiplanar CT image reconstructions and MIPs were obtained to evaluate the vascular anatomy. RADIATION DOSE REDUCTION: This exam was performed according to the departmental dose-optimization program which includes automated exposure control, adjustment of the mA and/or kV according to patient size and/or use of iterative reconstruction technique. CONTRAST:  OMNIPAQUE IOHEXOL 350 MG/ML SOLN COMPARISON:  Radiograph 04/01/2023 FINDINGS: Left-sided aortic arch with patent three-vessel origin. The left subclavian, axillary, and brachial arteries are patent and without evidence for aneurysm, dissection or occlusion. The radial and ulnar arteries are also patent and without dissection, aneurysm or occlusion. Vascular enhancement is visualized to the palmar arch. No acute osseous abnormality is seen. No abnormal axillary adenopathy. The soft tissues are unremarkable. Review of the MIP images confirms the above findings. IMPRESSION: Vascular patency of the left upper extremity to the level of the palmar arch Electronically Signed   By: Jasmine Pang M.D.   On: 04/01/2023 23:38   CT Angio Chest Aorta W and/or Wo Contrast Result Date: 04/01/2023 CLINICAL DATA:  Finger ischemia EXAM: CT ANGIOGRAPHY CHEST WITH CONTRAST TECHNIQUE: Multidetector CT imaging of the chest was performed using the standard protocol during bolus administration of intravenous contrast. Multiplanar CT image reconstructions and MIPs were obtained to evaluate the vascular anatomy. RADIATION DOSE REDUCTION: This exam was performed according to the departmental dose-optimization program which includes automated exposure control, adjustment of the mA and/or kV according to patient size and/or use of iterative  reconstruction technique. CONTRAST:  OMNIPAQUE IOHEXOL 350 MG/ML SOLN COMPARISON:  None Available. FINDINGS: Cardiovascular: Nonaneurysmal aorta. No dissection. Normal left-sided aortic arch with three-vessel origin. Normal cardiac size. No pericardial effusion Mediastinum/Nodes: No enlarged mediastinal, hilar, or axillary lymph nodes. Thyroid gland, trachea, and esophagus demonstrate no significant findings. Lungs/Pleura: Lungs are clear. No pleural effusion or pneumothorax. Upper Abdomen: No acute abnormality. Musculoskeletal: No chest wall abnormality. No acute or significant osseous findings. Review of the MIP images confirms the above findings. IMPRESSION: Negative for dissection or aneurysm. Negative contrast-enhanced chest CT Electronically Signed   By: Jasmine Pang M.D.   On: 04/01/2023 23:28   DG Finger Little Left Result Date: 04/01/2023 CLINICAL DATA:  Discoloration EXAM: LEFT FINGER(S) - 2+ VIEW COMPARISON:  None Available. FINDINGS: There is no evidence of fracture or dislocation. There is no evidence of arthropathy  or other focal bone abnormality. Soft tissues are unremarkable. IMPRESSION: Negative radiographs of the finger. Electronically Signed   By: Narda Rutherford M.D.   On: 04/01/2023 20:41           Achille Rich, PA-C 04/02/23 0143    Rondel Baton, MD 04/03/23 [phone]

## 2023-04-02 LAB — ANTITHROMBIN III: AntiThromb III Func: 112 % (ref 75–120)

## 2023-04-02 LAB — LIPID PANEL
Cholesterol: 184 mg/dL (ref 0–200)
HDL: 69 mg/dL (ref >40)
LDL Cholesterol: 104 mg/dL — ABNORMAL HIGH (ref 0–99)
Total CHOL/HDL Ratio: 2.7 {ratio}
Triglycerides: 56 mg/dL (ref <150)
VLDL: 11 mg/dL (ref 0–40)

## 2023-04-02 MED ORDER — ROSUVASTATIN CALCIUM 10 MG PO TABS: 10.0000 mg | ORAL_TABLET | Freq: Every day | ORAL | 0 refills | Status: DC

## 2023-04-02 NOTE — Discharge Instructions (Addendum)
You were seen in the emerged from today for evaluation of your finger.  Your workup was unremarkable.  We have spoken with vascular surgery and they would like for you to take a daily 81 mg aspirin.  Additionally, if your LDL is elevated they would like for you to start Crestor 10 mg.  Unfortunately, your lipid panel is not back yet.  I have gone ahead and sent you the prescription.  If your LDL is elevated on your MyChart you can take the medication however he can also follow-up with your PCP to review the results with you.  Please make sure you call your PCP and vascular surgery to schedule appointments.  Information for both is included in the discharge paperwork.  Please make sure you call.  Additionally, you were sent in ambulatory referral for your palpitations and your EKG.  If you have any worsening discoloration, worsening pain, swelling, decrease in sensation, inability to move it, please return to the ER.  If you have any concern, new or worsening symptoms, please return to the nearest ER for re-evaluation.

## 2023-04-03 ENCOUNTER — Ambulatory Visit (HOSPITAL_COMMUNITY)
Admission: RE | Admit: 2023-04-03 | Discharge: 2023-04-03 | Disposition: A | Discharge status: Home / Self Care | Payer: Managed Care, Other (non HMO) | Source: Ambulatory Visit | Attending: Vascular Surgery

## 2023-04-03 ENCOUNTER — Other Ambulatory Visit: Payer: Self-pay | Admitting: *Deleted

## 2023-04-03 ENCOUNTER — Ambulatory Visit (INDEPENDENT_AMBULATORY_CARE_PROVIDER_SITE_OTHER): Payer: Managed Care, Other (non HMO) | Admitting: Vascular Surgery

## 2023-04-03 ENCOUNTER — Encounter: Payer: Self-pay | Admitting: Vascular Surgery

## 2023-04-03 VITALS — BP 97/63 | HR 73 | Temp 97.9°F | Resp 20 | Ht 64.0 in | Wt 153.0 lb

## 2023-04-03 DIAGNOSIS — M79645 Pain in left finger(s): Secondary | ICD-10-CM

## 2023-04-03 DIAGNOSIS — L819 Disorder of pigmentation, unspecified: Secondary | ICD-10-CM

## 2023-04-03 LAB — BETA-2-GLYCOPROTEIN I ABS, IGG/M/A
Beta-2 Glyco I IgG: <9 GPI IgG units (ref 0–20)
Beta-2-Glycoprotein I IgA: <9 GPI IgA units (ref 0–25)
Beta-2-Glycoprotein I IgM: <9 GPI IgM units (ref 0–32)

## 2023-04-03 LAB — PROTEIN S ACTIVITY: Protein S Activity: 110 % (ref 63–140)

## 2023-04-03 LAB — LUPUS ANTICOAGULANT PANEL
DRVVT: 36.1 s (ref 0.0–47.0)
PTT Lupus Anticoagulant: 39.6 s (ref 0.0–43.5)

## 2023-04-03 LAB — PROTEIN C ACTIVITY: Protein C Activity: 130 % (ref 73–180)

## 2023-04-03 LAB — PROTEIN S, TOTAL: Protein S Ag, Total: 87 % (ref 60–150)

## 2023-04-03 LAB — HOMOCYSTEINE: Homocysteine: 13.8 umol/L (ref 0.0–14.5)

## 2023-04-03 NOTE — Progress Notes (Addendum)
Patient ID: Paula Crosby, female   DOB: [birth year], 44 y.o.   MRN: [id number]  Reason for Consult: New Patient (Initial Visit)   Referred by Rondel Baton, MD  Subjective:     HPI  Paula Crosby is a 43 y.o. female who presented earlier this week for discoloration of her left fifth finger.  She noticed shooting pain in her left 3rd through 5th fingers on Monday of this week and then began to notice her left fifth finger began to feel numb and have a purple discoloration.  Since then it has improved over the last week but she still has some numbness and tingling.  She denies any injury.  She denies history of Raynaud's.  She denies any previous vascular interventions.  She is a never smoker.  Past Medical History:  Diagnosis Date   Anxiety    HPV (human papilloma virus) infection    Migraines    Family History  Problem Relation Age of Onset   Heart disease Maternal Grandfather    Congestive Heart Failure Maternal Grandfather    Diabetes Maternal Grandfather    Hypertension Maternal Grandfather    Alcohol abuse Mother    Depression Mother    Migraines Mother    Alcohol abuse Brother    Depression Brother    Hypertension Brother    Migraines Brother    Past Surgical History:  Procedure Laterality Date   COLONOSCOPY     polyps in small intestine removed    COLPOSCOPY  2020   DILATION AND CURETTAGE OF UTERUS      Short Social History:  Social History   Tobacco Use   Smoking status: Never    Passive exposure: Current   Smokeless tobacco: Never  Substance Use Topics   Alcohol use: Yes    Alcohol/week: 1.0 standard drink of alcohol    Types: 1 Glasses of wine per week    Comment: RARE    No Known Allergies  Current Outpatient Medications  Medication Sig Dispense Refill   aspirin EC 81 MG tablet Take 1 tablet (81 mg total) by mouth daily. Swallow whole. 30 tablet 0   levonorgestrel (MIRENA) 20 MCG/DAY IUD 1 each by Intrauterine route once.     melatonin 1 MG  TABS tablet Take 3 mg by mouth at bedtime.     metroNIDAZOLE (FLAGYL) 500 MG tablet Take 1 tablet (500 mg total) by mouth 2 (two) times daily. 14 tablet 0   Rimegepant Sulfate (NURTEC) 75 MG TBDP Take 1 tablet (75 mg total) by mouth daily as needed. 10 tablet 5   rosuvastatin (CRESTOR) 10 MG tablet Take 1 tablet (10 mg total) by mouth daily. 30 tablet 0   sertraline (ZOLOFT) 100 MG tablet Take 1 tablet (100 mg total) by mouth daily. 90 tablet 1   No current facility-administered medications for this visit.    REVIEW OF SYSTEMS   All other systems were reviewed and are negative     Objective:  Objective   Vitals:   04/03/23 1351  BP: 97/63  Pulse: 73  Resp: 20  Temp: 97.9 F (36.6 C)  SpO2: 98%  Weight: 153 lb (69.4 kg)  Height: 5\' 4"  (1.626 m)   Body mass index is 26.26 kg/m.  Physical Exam General: no acute distress Cardiac: hemodynamically stable Pulm: normal work of breathing Neuro: alert, no focal deficit Extremities: no edema, cyanosis or wounds Vascular:   Right: Palpable radial, ulnar  Left: Palpable radial, ulnar   Data: Right  Doppler Findings:  +--------+--------+-----+---------+--------+  Site   PressureIndexDoppler  Comments  +--------+--------+-----+---------+--------+  [id number]          triphasic          +--------+--------+-----+---------+--------+  Radial 97      0.92 triphasic          +--------+--------+-----+---------+--------+  Ulnar  94      0.90 triphasic          +--------+--------+-----+---------+--------+  Digit  102     0.97 triphasic          +--------+--------+-----+---------+--------+   Left Doppler Findings:  +--------+--------+-----+---------+--------+  Site   PressureIndexDoppler  Comments  +--------+--------+-----+---------+--------+  [id number]         triphasic          +--------+--------+-----+---------+--------+  Radial 100     0.95 triphasic           +--------+--------+-----+---------+--------+  Ulnar  102     0.97 triphasic          +--------+--------+-----+---------+--------+  Digit  102     0.97 triphasic          +--------+--------+-----+---------+--------+   Independent reviewed the CTA of the left arm and CTA chest     Assessment/Plan:     Paula Crosby is a 43 y.o. female presenting for an episode of left fifth finger pain and discoloration that occurred on Monday.  It has improved throughout this week and her studies today demonstrated triphasic waveforms throughout her left arm and all 5 digits on the left hand.  I explained that all of her studies and scans have been normal and although it is frustrating to not have a diagnosis that is encouraging that her symptoms have been improving.  Follow-up as needed      Daria Pastures MD [facility]

## 2023-04-04 LAB — CARDIOLIPIN ANTIBODIES, IGG, IGM, IGA
Anticardiolipin IgA: <9 [APL'U]/mL (ref 0–11)
Anticardiolipin IgG: <9 [GPL'U]/mL (ref 0–14)
Anticardiolipin IgM: 15 [MPL'U]/mL — ABNORMAL HIGH (ref 0–12)

## 2023-04-05 LAB — PROTEIN C, TOTAL: Protein C, Total: 98 % (ref 60–150)

## 2023-04-06 ENCOUNTER — Encounter: Payer: Self-pay | Admitting: Internal Medicine

## 2023-04-06 NOTE — Progress Notes (Unsigned)
Subjective:    Patient ID: Paula Crosby, female    DOB: 11-10-1980, 43 y.o.   MRN: [id number]     HPI Abernathy is here for follow up from the hospital  1/14 - urgent care - left pinky finger pain - she was concerned about the blood flow.  The day before her pinky was cool to touch.  It got worse that day.  She denies any injury.  She had numbness and tingling in the left pinky finger.  She had 2+ radial pulses on the left.  Her left pinky finger had decreased sensation, was cool to touch and was ecchymotic from M IP to DIP.  She had normal range of motion.  She was referred to the emergency room.  In the ED discoloration was noted.  Cap refill about 2 seconds.  Sensation intact.  No swelling.  Compartments were soft.  Full range of motion.  X-ray negative.  Some blood work pending, cardiolipin antibody IgM indeterminate, other blood work negative.  CT angio of the upper extremity showed vascular patency of the L UE to the level of the palmar arch.  CT chest was negative for dissection or aneurysm.  EKG within normal limits.  Discharged home.  Saw vascular 1/17  - symptoms had improved.  Studies by vascular that day were normal.  She was told all of her studies and scans were normal.  No diagnosis.  Symptoms improved so no further follow-up was negative.   Medications and allergies reviewed with patient and updated if appropriate.  Current Outpatient Medications on File Prior to Visit  Medication Sig Dispense Refill   aspirin EC 81 MG tablet Take 1 tablet (81 mg total) by mouth daily. Swallow whole. 30 tablet 0   levonorgestrel (MIRENA) 20 MCG/DAY IUD 1 each by Intrauterine route once.     melatonin 1 MG TABS tablet Take 3 mg by mouth at bedtime.     metroNIDAZOLE (FLAGYL) 500 MG tablet Take 1 tablet (500 mg total) by mouth 2 (two) times daily. 14 tablet 0   Rimegepant Sulfate (NURTEC) 75 MG TBDP Take 1 tablet (75 mg total) by mouth daily as needed. 10 tablet 5   rosuvastatin (CRESTOR)  10 MG tablet Take 1 tablet (10 mg total) by mouth daily. 30 tablet 0   sertraline (ZOLOFT) 100 MG tablet Take 1 tablet (100 mg total) by mouth daily. 90 tablet 1   No current facility-administered medications on file prior to visit.     Review of Systems     Objective:  There were no vitals filed for this visit. BP Readings from Last 3 Encounters:  04/03/23 97/63  04/02/23 112/71  03/31/23 111/73   Wt Readings from Last 3 Encounters:  04/03/23 153 lb (69.4 kg)  03/31/23 154 lb 3.7 oz (70 kg)  11/14/22 154 lb (69.9 kg)   There is no height or weight on file to calculate BMI.    Physical Exam     Lab Results  Component Value Date   WBC 6.9 04/01/2023   HGB 12.7 04/01/2023   HCT 37.0 04/01/2023   PLT 336 04/01/2023   GLUCOSE 89 04/01/2023   CHOL 184 04/01/2023   TRIG 56 04/01/2023   HDL 69 04/01/2023   LDLCALC 104 (H) 04/01/2023   ALT 11 11/06/2022   AST 14 11/06/2022   NA 139 04/01/2023   K 3.7 04/01/2023   CL 105 04/01/2023   CREATININE 0.76 04/01/2023   BUN 10 04/01/2023  CO2 25 04/01/2023   TSH 1.46 11/13/2022     Assessment & Plan:    See Problem List for Assessment and Plan of chronic medical problems.

## 2023-04-07 ENCOUNTER — Ambulatory Visit: Payer: Managed Care, Other (non HMO) | Attending: Internal Medicine

## 2023-04-07 ENCOUNTER — Ambulatory Visit (INDEPENDENT_AMBULATORY_CARE_PROVIDER_SITE_OTHER): Payer: Managed Care, Other (non HMO) | Admitting: Internal Medicine

## 2023-04-07 VITALS — BP 116/82 | HR 61 | Ht 64.0 in | Wt 153.6 lb

## 2023-04-07 DIAGNOSIS — R002 Palpitations: Secondary | ICD-10-CM

## 2023-04-07 DIAGNOSIS — M79645 Pain in left finger(s): Secondary | ICD-10-CM | POA: Diagnosis not present

## 2023-04-07 DIAGNOSIS — F3289 Other specified depressive episodes: Secondary | ICD-10-CM

## 2023-04-07 DIAGNOSIS — F32A Depression, unspecified: Secondary | ICD-10-CM | POA: Insufficient documentation

## 2023-04-07 DIAGNOSIS — G43009 Migraine without aura, not intractable, without status migrainosus: Secondary | ICD-10-CM

## 2023-04-07 DIAGNOSIS — K219 Gastro-esophageal reflux disease without esophagitis: Secondary | ICD-10-CM | POA: Diagnosis not present

## 2023-04-07 MED ORDER — RIZATRIPTAN BENZOATE 5 MG PO TABS: 5.0000 mg | ORAL_TABLET | ORAL | 5 refills | Status: AC | PRN

## 2023-04-07 MED ORDER — FAMOTIDINE 40 MG PO TABS
ORAL_TABLET | ORAL | 0 refills | Status: AC
Start: 2023-04-07 — End: ?

## 2023-04-07 NOTE — Patient Instructions (Addendum)
    Medications changes include :   pepcid 40 mg daily - take daily for 1-2 weeks and then take it only as needed.   Maxalt as needed for migraines.     A monitor for your heart was ordered and someone will call you to schedule an appointment.     Return if symptoms worsen or fail to improve.

## 2023-04-07 NOTE — Progress Notes (Unsigned)
EP to read

## 2023-04-08 LAB — FACTOR 5 LEIDEN

## 2023-04-10 LAB — PROTHROMBIN GENE MUTATION

## 2023-04-12 ENCOUNTER — Other Ambulatory Visit: Payer: Self-pay | Admitting: Internal Medicine

## 2023-04-14 NOTE — Progress Notes (Unsigned)
Cardiology Office Note:   Date:  04/15/2023  ID:  Paula Crosby, DOB 1980/07/25, MRN [id number] PCP:  Etta Grandchild, MD  [facility] Providers Cardiologist:  Alverda Skeans, MD Referring MD: Mannie Stabile, [place]*  Chief Complaint/Reason for Referral: Palpitations ASSESSMENT:    1. Palpitations   2. BMI 26.0-26.9,adult   3. Snoring     PLAN:   In order of problems listed above: Palpitations: Will check reflex TSH and echocardiogram.  A monitor is in place.  We will review these results when available. Elevated BMI: Diet and exercise modification. Snoring: Given daytime somnolence and possible snoring will refer for sleep apnea evaluation.              Dispo:  Return if symptoms worsen or fail to improve.      Medication Adjustments/Labs and Tests Ordered: Current medicines are reviewed at length with the patient today.  Concerns regarding medicines are outlined above.  The following changes have been made:  no change   Labs/tests ordered: Orders Placed This Encounter  Procedures   TSH Rfx on Abnormal to Free T4   ECHOCARDIOGRAM COMPLETE   Home sleep test    Medication Changes: No orders of the defined types were placed in this encounter.   Current medicines are reviewed at length with the patient today.  The patient does not have concerns regarding medicines.    History of Present Illness:      FOCUSED PROBLEM LIST:   BMI 26 GERD Hyperlipidemia  The patient is a 43 year old female with the above listed medical problems who is referred for an evaluation regarding palpitations.  Apparently the patient was in the emergency department earlier this month with paresthesias of her left hand and fingers as well as discoloration.  Hypercoagulable workup was negative.  A chest CT was also negative.  Her EKG showed no ischemic changes.  She is seen by vascular surgery.  No clear etiology was determined.  The patient was empirically started on aspirin and Crestor.   An upper extremity ultrasound was negative.  She was seen in by her primary care provider.  She is referred for an evaluation regarding ongoing palpitations.  The patient tells me that she has had palpitations maybe on a daily basis.  This occurs typically at night when she is laying down or resting.  They are not associate with chest pain or shortness of breath.  They do not seem to occur when she exerts herself.  She does exercise but has not done so since before the holidays.  She typically runs and has no issues with this activity.  She works had a high stress job in [employer].  She tells me she is not sure if she snores but she might.  She does report daytime somnolence and nonrestorative sleep.  In regards to her left finger she has had no recurrence of what she experienced earlier.          Current Medications: Current Meds  Medication Sig   aspirin EC 81 MG tablet Take 1 tablet (81 mg total) by mouth daily. Swallow whole.   famotidine (PEPCID) 40 MG tablet Take daily for 1-2 weeks then prn   levonorgestrel (MIRENA) 20 MCG/DAY IUD 1 each by Intrauterine route once.   melatonin 1 MG TABS tablet Take 3 mg by mouth at bedtime.   rizatriptan (MAXALT) 5 MG tablet Take 1 tablet (5 mg total) by mouth as needed for migraine. May repeat in 2 hours if  needed   sertraline (ZOLOFT) 100 MG tablet Take 1 tablet (100 mg total) by mouth daily.     Review of Systems:   Please see the history of present illness.    All other systems reviewed and are negative.     EKGs/Labs/Other Test Reviewed:   EKG: EKG performed January 2025 demonstrates sinus rhythm with nonspecific ST changes.  EKG Interpretation Date/Time:    Ventricular Rate:    PR Interval:    QRS Duration:    QT Interval:    QTC Calculation:   R Axis:      Text Interpretation:           Risk Assessment/Calculations:          Physical Exam:   VS:  BP 106/82   Pulse 75   Ht 5\' 4"  (1.626 m)   Wt 150 lb  9.6 oz (68.3 kg)   LMP  (LMP Unknown)   SpO2 99%   BMI 25.85 kg/m        Wt Readings from Last 3 Encounters:  04/15/23 150 lb 9.6 oz (68.3 kg)  04/07/23 153 lb 9.6 oz (69.7 kg)  04/03/23 153 lb (69.4 kg)      GENERAL:  No apparent distress, AOx3 HEENT:  No carotid bruits, +2 carotid impulses, no scleral icterus CAR: RRR no murmurs, gallops, rubs, or thrills RES:  Clear to auscultation bilaterally ABD:  Soft, nontender, nondistended, positive bowel sounds x 4 VASC:  +2 radial pulses, +2 carotid pulses NEURO:  CN 2-12 grossly intact; motor and sensory grossly intact PSYCH:  No active depression or anxiety EXT:  No edema, ecchymosis, or cyanosis  Signed, Orbie Pyo, MD  04/15/2023 2:08 PM    [facility] [address] [place], [place], [place]  [address] Phone: [phone]; Fax: [phone]   Note:  This document was prepared using Dragon voice recognition software and may include unintentional dictation errors.

## 2023-04-15 ENCOUNTER — Encounter: Payer: Self-pay | Admitting: Internal Medicine

## 2023-04-15 ENCOUNTER — Ambulatory Visit: Payer: Managed Care, Other (non HMO) | Attending: Internal Medicine | Admitting: Internal Medicine

## 2023-04-15 VITALS — BP 106/82 | HR 75 | Ht 64.0 in | Wt 150.6 lb

## 2023-04-15 DIAGNOSIS — Z6826 Body mass index (BMI) 26.0-26.9, adult: Secondary | ICD-10-CM

## 2023-04-15 DIAGNOSIS — R0683 Snoring: Secondary | ICD-10-CM

## 2023-04-15 DIAGNOSIS — R002 Palpitations: Secondary | ICD-10-CM | POA: Diagnosis not present

## 2023-04-15 NOTE — Patient Instructions (Addendum)
Medication Instructions:  Your physician recommends that you continue on your current medications as directed. Please refer to the Current Medication list given to you today.  *If you need a refill on your cardiac medications before your next appointment, please call your pharmacy*  Lab Work: TODAY (1st floor-[address]): reflex TSH If you have labs (blood work) drawn today and your tests are completely normal, you will receive your results only by: MyChart Message (if you have MyChart) OR A paper copy in the mail If you have any lab test that is abnormal or we need to change your treatment, we will call you to review the results.  Testing/Procedures: Your physician has requested that you have an echocardiogram. Echocardiography is a painless test that uses sound waves to create images of your heart. It provides your doctor with information about the size and shape of your heart and how well your heart's chambers and valves are working. This procedure takes approximately one hour. There are no restrictions for this procedure. Please do NOT wear cologne, perfume, aftershave, or lotions (deodorant is allowed). Please arrive 15 minutes prior to your appointment time.  Please note: We ask at that you not bring children with you during ultrasound (echo/ vascular) testing. Due to room size and safety concerns, children are not allowed in the ultrasound rooms during exams. Our front office staff cannot provide observation of children in our lobby area while testing is being conducted. An adult accompanying a patient to their appointment will only be allowed in the ultrasound room at the discretion of the ultrasound technician under special circumstances. We apologize for any inconvenience.  Your physician has recommended that you have a sleep study. This test records several body functions during sleep, including: brain activity, eye movement, oxygen and carbon dioxide blood levels, heart rate and rhythm,  breathing rate and rhythm, the flow of air through your mouth and nose, snoring, body muscle movements, and chest and belly movement. This will be arranged through [facility], their coordinator will call you in 2-4 weeks to arrange home sleepy study.  Follow-Up: At [facility], you and your health needs are our priority.  As part of our continuing mission to provide you with exceptional heart care, we have created designated Provider Care Teams.  These Care Teams include your primary Cardiologist (physician) and Advanced Practice Providers (APPs -  Physician Assistants and Nurse Practitioners) who all work together to provide you with the care you need, when you need it.  Your next appointment:   As needed  The format for your next appointment:   In Person  Provider:   Orbie Pyo, MD {  Other Instructions    1st Floor: - Lobby - Registration  - Pharmacy  - Lab - Cafe  2nd Floor: - PV Lab - Diagnostic Testing (echo, CT, nuclear med)  3rd Floor: - Vacant  4th Floor: - TCTS (cardiothoracic surgery) - AFib Clinic - Structural Heart Clinic - Vascular Surgery  - Vascular Ultrasound  5th Floor: - HeartCare Cardiology (general and EP) - Clinical Pharmacy for coumadin, hypertension, lipid, weight-loss medications, and med management appointments    Valet parking services will be available as well.

## 2023-04-16 ENCOUNTER — Encounter: Payer: Self-pay | Admitting: Internal Medicine

## 2023-04-16 LAB — TSH RFX ON ABNORMAL TO FREE T4: TSH: 1.69 u[IU]/mL (ref 0.450–4.500)

## 2023-04-23 ENCOUNTER — Telehealth: Payer: Self-pay | Admitting: *Deleted

## 2023-04-23 NOTE — Telephone Encounter (Signed)
 Prior Authorization for HST sent to [employer] via web portal. Tracking Number .[id number] PA REQ FOR HST

## 2023-05-04 ENCOUNTER — Ambulatory Visit (HOSPITAL_COMMUNITY): Payer: Managed Care, Other (non HMO) | Attending: Cardiology

## 2023-05-04 ENCOUNTER — Encounter: Payer: Self-pay | Admitting: Internal Medicine

## 2023-05-04 DIAGNOSIS — R002 Palpitations: Secondary | ICD-10-CM | POA: Insufficient documentation

## 2023-05-04 LAB — ECHOCARDIOGRAM COMPLETE
Area-P 1/2: 2.33 cm2
P 1/2 time: 381 ms
S' Lateral: 2.9 cm

## 2023-05-06 ENCOUNTER — Encounter: Payer: Self-pay | Admitting: Internal Medicine

## 2023-05-06 DIAGNOSIS — R002 Palpitations: Secondary | ICD-10-CM

## 2023-05-15 ENCOUNTER — Other Ambulatory Visit: Payer: Self-pay | Admitting: Internal Medicine

## 2023-06-01 ENCOUNTER — Other Ambulatory Visit: Payer: Self-pay | Admitting: Nurse Practitioner

## 2023-06-01 DIAGNOSIS — Z09 Encounter for follow-up examination after completed treatment for conditions other than malignant neoplasm: Secondary | ICD-10-CM

## 2023-06-01 DIAGNOSIS — N6489 Other specified disorders of breast: Secondary | ICD-10-CM

## 2023-06-05 ENCOUNTER — Ambulatory Visit: Payer: Managed Care, Other (non HMO) | Admitting: Cardiovascular Disease

## 2023-06-05 ENCOUNTER — Encounter (HOSPITAL_BASED_OUTPATIENT_CLINIC_OR_DEPARTMENT_OTHER): Payer: Managed Care, Other (non HMO) | Admitting: Cardiology

## 2023-06-11 ENCOUNTER — Ambulatory Visit: Admission: RE | Admit: 2023-06-11 | Source: Ambulatory Visit

## 2023-06-11 ENCOUNTER — Ambulatory Visit
Admission: RE | Admit: 2023-06-11 | Discharge: 2023-06-11 | Disposition: A | Discharge status: Home / Self Care | Source: Ambulatory Visit | Attending: Nurse Practitioner

## 2023-06-11 DIAGNOSIS — Z09 Encounter for follow-up examination after completed treatment for conditions other than malignant neoplasm: Secondary | ICD-10-CM

## 2023-06-11 DIAGNOSIS — N6489 Other specified disorders of breast: Secondary | ICD-10-CM

## 2023-06-22 ENCOUNTER — Other Ambulatory Visit: Payer: Self-pay | Admitting: Internal Medicine

## 2023-06-22 NOTE — Telephone Encounter (Signed)
 Copied from CRM [id number]. Topic: Clinical - Medication Refill >> Jun 22, 2023 12:15 PM Almira Coaster wrote: Most Recent Primary Care Visit:  Provider: BURNS, Bobette Mo  Department: [facility]  Visit Type: HOSPITAL FOLLOW UP  Date: 04/07/2023  Medication: sertraline (ZOLOFT) 100 MG tablet   Has the patient contacted their pharmacy? Yes (Agent: If no, request that the patient contact the pharmacy for the refill. If patient does not wish to contact the pharmacy document the reason why and proceed with request.) (Agent: If yes, when and what did the pharmacy advise?)  Is this the correct pharmacy for this prescription? Yes If no, delete pharmacy and type the correct one.  This is the patient's preferred pharmacy:  [id number] [place], [place] - [phone] AT [facility] OF [facility] & [address] [place] [address] Phone: [phone] Fax: [phone]    Has the prescription been filled recently? No  Is the patient out of the medication? Yes  Has the patient been seen for an appointment in the last year OR does the patient have an upcoming appointment? Yes  Can we respond through MyChart? Yes  Agent: Please be advised that Rx refills may take up to 3 business days. We ask that you follow-up with your pharmacy.

## 2023-06-23 MED ORDER — SERTRALINE HCL 100 MG PO TABS: 100.0000 mg | ORAL_TABLET | Freq: Every day | ORAL | 0 refills | Status: DC

## 2023-07-23 ENCOUNTER — Other Ambulatory Visit: Payer: Self-pay | Admitting: Internal Medicine

## 2023-08-06 ENCOUNTER — Other Ambulatory Visit: Payer: Self-pay | Admitting: Internal Medicine

## 2023-08-06 NOTE — Telephone Encounter (Signed)
 Last Fill: 06/23/23  Last OV: 04/07/23 Next OV: None Scheduled  Routing to provider for review/authorization.

## 2023-08-06 NOTE — Telephone Encounter (Signed)
 Copied from CRM [id number]. Topic: Clinical - Medication Refill >> Aug 06, 2023 11:28 AM Dewanda Foots wrote: Medication:  sertraline  (ZOLOFT ) 100 MG tablet  Has the patient contacted their pharmacy? Yes (Agent: If no, request that the patient contact the pharmacy for the refill. If patient does not wish to contact the pharmacy document the reason why and proceed with request.) (Agent: If yes, when and what did the pharmacy advise?)  This is the patient's preferred pharmacy:  [facility] #[id number] [place], [place] - [address] AT [facility] OF [facility] & [address] [place] [address] Phone: [phone] Fax: [phone]  Is this the correct pharmacy for this prescription? Yes If no, delete pharmacy and type the correct one.   Has the prescription been filled recently? No  Is the patient out of the medication? Yes  Has the patient been seen for an appointment in the last year OR does the patient have an upcoming appointment? Yes  Can we respond through MyChart? Yes  Agent: Please be advised that Rx refills may take up to 3 business days. We ask that you follow-up with your pharmacy.

## 2023-08-17 ENCOUNTER — Telehealth: Payer: Self-pay | Admitting: Internal Medicine

## 2023-08-17 NOTE — Telephone Encounter (Unsigned)
 Copied from CRM [id number]. Topic: Clinical - Medication Refill >> Aug 17, 2023 12:35 PM [place] E wrote: Medication: sertraline  (ZOLOFT ) 100 MG tablet   Has the patient contacted their pharmacy? Yes (Agent: If no, request that the patient contact the pharmacy for the refill. If patient does not wish to contact the pharmacy document the reason why and proceed with request.) (Agent: If yes, when and what did the pharmacy advise?)  This is the patient's preferred pharmacy:  [facility] #[id number] [place], [place] - [address] AT [facility] OF [facility] & [address] [place] [address] Phone: [phone] Fax: [phone]    Is this the correct pharmacy for this prescription? Yes If no, delete pharmacy and type the correct one.   Has the prescription been filled recently? No  Is the patient out of the medication? Yes  Has the patient been seen for an appointment in the last year OR does the patient have an upcoming appointment? Yes  Can we respond through MyChart? Yes  Agent: Please be advised that Rx refills may take up to 3 business days. We ask that you follow-up with your pharmacy.

## 2023-08-18 NOTE — Telephone Encounter (Signed)
 Patient called back to check the status of refill. I informed her that it is currently pending. She asked for another message to be sent back because she needs it urgently. She mentioned that she has been trying to get it since early May.

## 2023-08-20 NOTE — Telephone Encounter (Signed)
 Copied from CRM [id number]. Topic: Clinical - Prescription Issue >> Aug 20, 2023 10:41 AM Kita Perish H wrote: Reason for CRM: Patient has been trying to get refill for sertraline  (ZOLOFT ) 100 MG tablet, per provider patient needs to schedule an appointment which she has for 6/17 Dr. Rochelle Chu next available but patient is out and has been out of the sertraline  (ZOLOFT ) 100 MG tablet. Wants to know if some can be called in to the pharmacy on file to hold her over until appointment, please reach out.  Charlsey [phone]

## 2023-08-21 ENCOUNTER — Other Ambulatory Visit: Payer: Self-pay | Admitting: Internal Medicine

## 2023-08-24 ENCOUNTER — Ambulatory Visit: Payer: Self-pay

## 2023-08-24 NOTE — Telephone Encounter (Signed)
  No triage. See note.       Copied from CRM [id number]. Topic: Clinical - Prescription Issue >> Aug 24, 2023  3:25 PM Paula Crosby wrote: Reason for CRM: Patient stated she has been without her medication: sertraline  (ZOLOFT ) 100 MG tablet [[id number]] since the end of May and she is on the verge of a nervous break down because she cannot wait until 6/17.  Requesting to speak with a nurse. Reason for Disposition  [1] Caller requesting NON-URGENT health information AND [2] PCP's office is the best resource  Answer Assessment - Initial Assessment Questions 1. REASON FOR CALL or QUESTION: "What is your reason for calling today?" or "How can I best help you?" or "What question do you have that I can help answer?"     Pt wanting to know why refill from 6/2 hasn't been ordered. CAL stated it was because pt hasn't seen PCP since 06/2021. Pt is upset this wasn't addressed at earlier appt. Pt stated "I can't ever get in to see Dr Rochelle Chu so I see other and know I find out they can't order medication. This is so disappointing. RN requested pt to be put on waitlist.  Protocols used: Information Only Call - No Triage-A-AH

## 2023-08-26 ENCOUNTER — Other Ambulatory Visit: Payer: Self-pay

## 2023-08-26 MED ORDER — SERTRALINE HCL 100 MG PO TABS: 100.0000 mg | ORAL_TABLET | Freq: Every day | ORAL | 1 refills | Status: DC

## 2023-08-26 NOTE — Telephone Encounter (Signed)
 Medication sent in.

## 2023-09-01 ENCOUNTER — Encounter: Payer: Self-pay | Admitting: Internal Medicine

## 2023-09-01 ENCOUNTER — Ambulatory Visit (INDEPENDENT_AMBULATORY_CARE_PROVIDER_SITE_OTHER): Admitting: Internal Medicine

## 2023-09-01 VITALS — BP 108/70 | HR 60 | Temp 97.7°F | Ht 64.0 in | Wt 148.2 lb

## 2023-09-01 DIAGNOSIS — F411 Generalized anxiety disorder: Secondary | ICD-10-CM | POA: Diagnosis not present

## 2023-09-01 DIAGNOSIS — Z114 Encounter for screening for human immunodeficiency virus [HIV]: Secondary | ICD-10-CM | POA: Insufficient documentation

## 2023-09-01 DIAGNOSIS — Z0000 Encounter for general adult medical examination without abnormal findings: Secondary | ICD-10-CM | POA: Insufficient documentation

## 2023-09-01 DIAGNOSIS — Z1159 Encounter for screening for other viral diseases: Secondary | ICD-10-CM | POA: Insufficient documentation

## 2023-09-01 MED ORDER — SERTRALINE HCL 100 MG PO TABS: 100.0000 mg | ORAL_TABLET | Freq: Every day | ORAL | 1 refills | Status: AC

## 2023-09-01 NOTE — Patient Instructions (Signed)
Palpitations Palpitations are feelings that your heartbeat is irregular or is faster than normal. It may feel like your heart is fluttering or skipping a beat. Palpitations may be caused by many things, including smoking, caffeine, alcohol, stress, and certain medicines or drugs. Most causes of palpitations are not serious.  However, some palpitations can be a sign of a serious problem. Further tests and a thorough medical history will be done to find the cause of your palpitations. Your provider may order tests such as an ECG, labs, an echocardiogram, or an ambulatory continuous ECG monitor. Follow these instructions at home: Pay attention to any changes in your symptoms. Let your health care provider know about them. Take these actions to help manage your symptoms: Eating and drinking Follow instructions from your health care provider about eating or drinking restrictions. You may need to avoid foods and drinks that may cause palpitations. These may include: Caffeinated coffee, tea, soft drinks, and energy drinks. Chocolate. Alcohol. Diet pills. Lifestyle     Take steps to reduce your stress and anxiety. Things that can help you relax include: Yoga. Mind-body activities, such as deep breathing, meditation, or using words and images to create positive thoughts (guided imagery). Physical activity, such as swimming, jogging, or walking. Tell your health care provider if your palpitations increase with activity. If you have chest pain or shortness of breath with activity, do not continue the activity until you are seen by your health care provider. Biofeedback. This is a method that helps you learn to use your mind to control things in your body, such as your heartbeat. Get plenty of rest and sleep. Keep a regular bed time. Do not use drugs, including cocaine or ecstasy. Do not use marijuana. Do not use any products that contain nicotine or tobacco. These products include cigarettes, chewing  tobacco, and vaping devices, such as e-cigarettes. If you need help quitting, ask your health care provider. General instructions Take over-the-counter and prescription medicines only as told by your health care provider. Keep all follow-up visits. This is important. These may include visits for further testing if palpitations do not go away or get worse. Contact a health care provider if: You continue to have a fast or irregular heartbeat for a long period of time. You notice that your palpitations occur more often. Get help right away if: You have chest pain or shortness of breath. You have a severe headache. You feel dizzy or you faint. These symptoms may represent a serious problem that is an emergency. Do not wait to see if the symptoms will go away. Get medical help right away. Call your local emergency services (911 in the [place] Do not drive yourself to the hospital. Summary Palpitations are feelings that your heartbeat is irregular or is faster than normal. It may feel like your heart is fluttering or skipping a beat. Palpitations may be caused by many things, including smoking, caffeine, alcohol, stress, certain medicines, and drugs. Further tests and a thorough medical history may be done to find the cause of your palpitations. Get help right away if you faint or have chest pain, shortness of breath, severe headache, or dizziness. This information is not intended to replace advice given to you by your health care provider. Make sure you discuss any questions you have with your health care provider. Document Revised: 07/25/2020 Document Reviewed: 07/25/2020 Elsevier Patient Education  2024 [employer].

## 2023-09-01 NOTE — Progress Notes (Signed)
 Subjective:  Patient ID: Paula Crosby, female    DOB: [birth year]  Age: 43 y.o. MRN: [id number]  CC: Palpitations   HPI Paula Crosby presents for f/up ----  Discussed the use of AI scribe software for clinical note transcription with the patient, who gave verbal consent to proceed.  History of Present Illness   Paula Crosby is a 43 year old female who presents with palpitations.  She experiences palpitations described as 'skipping beats' that occur throughout the day and are not influenced by physical activity. She runs regularly without exacerbation of symptoms. No dizziness, lightheadedness, chest pain, or shortness of breath during exertion.  Her symptoms began with her fingers turning blue, leading to visits to urgent care and the ER. A comprehensive cardiac workup, including heart monitoring, was performed, and she was informed that everything was normal.  She is currently taking sertraline  and is satisfied with her current dose, stating it is necessary for her well-being.  Her menstrual cycle is irregular, with spotting occurring about a month to a month and a half ago. She uses Mirena for contraception and confirms there is no risk of pregnancy.       Outpatient Medications Prior to Visit  Medication Sig Dispense Refill   levonorgestrel (MIRENA) 20 MCG/DAY IUD 1 each by Intrauterine route once.     melatonin 1 MG TABS tablet Take 3 mg by mouth at bedtime.     rizatriptan  (MAXALT ) 5 MG tablet Take 1 tablet (5 mg total) by mouth as needed for migraine. May repeat in 2 hours if needed 10 tablet 5   sertraline  (ZOLOFT ) 100 MG tablet Take 1 tablet (100 mg total) by mouth daily. NEEDS APPOINTMENT WITH PCP FOR ANY REFILLS. 30 tablet 1   famotidine  (PEPCID ) 40 MG tablet Take daily for 1-2 weeks then prn 30 tablet 0   rosuvastatin  (CRESTOR ) 10 MG tablet Take 1 tablet (10 mg total) by mouth daily. (Patient not taking: Reported on 04/15/2023) 30 tablet 0   No facility-administered  medications prior to visit.    ROS Review of Systems  Constitutional:  Negative for appetite change, chills, diaphoresis, fatigue and fever.  HENT: Negative.    Eyes: Negative.   Respiratory:  Negative for cough, chest tightness, shortness of breath and wheezing.   Cardiovascular:  Positive for palpitations. Negative for chest pain and leg swelling.  Gastrointestinal:  Negative for abdominal pain, diarrhea, nausea and vomiting.  Endocrine: Negative.   Genitourinary: Negative.  Negative for difficulty urinating.  Musculoskeletal: Negative.  Negative for arthralgias and myalgias.  Skin: Negative.   Neurological:  Negative for dizziness, weakness and light-headedness.  Hematological:  Negative for adenopathy. Does not bruise/bleed easily.  Psychiatric/Behavioral: Negative.      Objective:  BP 108/70 (BP Location: Left Arm, Patient Position: Sitting, Cuff Size: Normal)   Pulse 60   Temp 97.7 F (36.5 C) (Oral)   Ht 5' 4 (1.626 m)   Wt 148 lb 3.2 oz (67.2 kg)   SpO2 98%   BMI 25.44 kg/m   BP Readings from Last 3 Encounters:  09/01/23 108/70  04/15/23 106/82  04/07/23 116/82    Wt Readings from Last 3 Encounters:  09/01/23 148 lb 3.2 oz (67.2 kg)  04/15/23 150 lb 9.6 oz (68.3 kg)  04/07/23 153 lb 9.6 oz (69.7 kg)    Physical Exam Vitals reviewed.  Constitutional:      Appearance: Normal appearance.  HENT:     Nose: Nose normal.     Mouth/Throat:  Mouth: Mucous membranes are moist.   Eyes:     General: No scleral icterus.    Conjunctiva/sclera: Conjunctivae normal.    Cardiovascular:     Rate and Rhythm: Normal rate and regular rhythm.     Heart sounds: No murmur heard.    No friction rub. No gallop.  Pulmonary:     Effort: Pulmonary effort is normal.     Breath sounds: No stridor. No wheezing, rhonchi or rales.  Abdominal:     General: Abdomen is flat.     Palpations: There is no mass.     Tenderness: There is no abdominal tenderness. There is no  guarding.     Hernia: No hernia is present.   Musculoskeletal:        General: Normal range of motion.     Cervical back: Neck supple.     Right lower leg: No edema.     Left lower leg: No edema.  Lymphadenopathy:     Cervical: No cervical adenopathy.   Skin:    General: Skin is warm and dry.   Neurological:     General: No focal deficit present.     Mental Status: She is alert.   Psychiatric:        Mood and Affect: Mood normal.        Behavior: Behavior normal.        Thought Content: Thought content normal.        Judgment: Judgment normal.     Lab Results  Component Value Date   WBC 6.9 04/01/2023   HGB 12.7 04/01/2023   HCT 37.0 04/01/2023   PLT 336 04/01/2023   GLUCOSE 89 04/01/2023   CHOL 184 04/01/2023   TRIG 56 04/01/2023   HDL 69 04/01/2023   LDLCALC 104 (H) 04/01/2023   ALT 11 11/06/2022   AST 14 11/06/2022   NA 139 04/01/2023   K 3.7 04/01/2023   CL 105 04/01/2023   CREATININE 0.76 04/01/2023   BUN 10 04/01/2023   CO2 25 04/01/2023   TSH 1.690 04/15/2023    MM 3D DIAGNOSTIC MAMMOGRAM BILATERAL BREAST Result Date: 06/11/2023 CLINICAL DATA:  43 year old female presenting for 2 year follow-up of a probably benign left breast asymmetry and for annual exam of the right breast. EXAM: DIGITAL DIAGNOSTIC BILATERAL MAMMOGRAM WITH TOMOSYNTHESIS AND CAD TECHNIQUE: Bilateral digital diagnostic mammography and breast tomosynthesis was performed. The images were evaluated with computer-aided detection. COMPARISON:  Previous exam(s). ACR Breast Density Category c: The breasts are heterogeneously dense, which may obscure small masses. FINDINGS: Right breast: No suspicious mass, distortion, or microcalcifications are identified to suggest presence of malignancy. Left breast: There is a stable asymmetry in the superior far posterior left breast likely representing normal dense fibroglandular tissue. There are no new suspicious findings elsewhere in the left breast.  IMPRESSION: 1. Stable asymmetry in the upper posterior left breast which given stability for 2 years is considered benign. 2. No mammographic evidence of malignancy bilaterally. RECOMMENDATION: Screening mammogram in one year.(Code:SM-B-01Y) I have discussed the findings and recommendations with the patient. If applicable, a reminder letter will be sent to the patient regarding the next appointment. BI-RADS CATEGORY  2: Benign. Electronically Signed   By: Inocente Ast M.D.   On: 06/11/2023 13:11    Assessment & Plan:   GAD (generalized anxiety disorder)- Doing well on the current sertraline  dose. -     Sertraline  HCl; Take 1 tablet (100 mg total) by mouth daily.  Dispense: 90 tablet; Refill:  1     Follow-up: Return in about 6 months (around 03/02/2024).  Debby Molt, MD

## 2023-10-25 ENCOUNTER — Other Ambulatory Visit: Payer: Self-pay | Admitting: Internal Medicine

## 2023-10-25 DIAGNOSIS — F411 Generalized anxiety disorder: Secondary | ICD-10-CM

## 2023-11-02 NOTE — Telephone Encounter (Unsigned)
 Copied from CRM #[id number]. Topic: Clinical - Prescription Issue >> Nov 02, 2023  3:46 PM Berneda FALCON wrote: Reason for CRM: Patient states that pharmacy is stating we need a prior auth for her sertraline  (ZOLOFT ) 100 MG tablet and this was requested on 8/10. Can we please follow up with this? She has 2 days left  [id number] [address], [place] - [address] AT [facility] OF [facility] & [address] [place] [address] Phone: [phone] Fax: [phone] Hours: Not open 24 hours

## 2023-11-09 ENCOUNTER — Ambulatory Visit (INDEPENDENT_AMBULATORY_CARE_PROVIDER_SITE_OTHER): Payer: Managed Care, Other (non HMO) | Admitting: Nurse Practitioner

## 2023-11-09 ENCOUNTER — Encounter: Payer: Self-pay | Admitting: Nurse Practitioner

## 2023-11-09 VITALS — BP 100/80 | HR 62 | Ht 63.78 in | Wt 145.0 lb

## 2023-11-09 DIAGNOSIS — N930 Postcoital and contact bleeding: Secondary | ICD-10-CM

## 2023-11-09 DIAGNOSIS — Z01419 Encounter for gynecological examination (general) (routine) without abnormal findings: Secondary | ICD-10-CM | POA: Diagnosis not present

## 2023-11-09 DIAGNOSIS — E559 Vitamin D deficiency, unspecified: Secondary | ICD-10-CM

## 2023-11-09 DIAGNOSIS — N921 Excessive and frequent menstruation with irregular cycle: Secondary | ICD-10-CM

## 2023-11-09 DIAGNOSIS — Z1331 Encounter for screening for depression: Secondary | ICD-10-CM

## 2023-11-09 DIAGNOSIS — Z975 Presence of (intrauterine) contraceptive device: Secondary | ICD-10-CM

## 2023-11-09 DIAGNOSIS — Z30431 Encounter for routine checking of intrauterine contraceptive device: Secondary | ICD-10-CM

## 2023-11-09 NOTE — Progress Notes (Signed)
 Paula Crosby November 29, 1980 [id number]   History:  43 y.o. [id number] presents for annual exam. Mirena IUD 08/2018. Has started having some spotting, especially with intercourse. No pain with intercourse. Has been having more cramping. Wondering if she should go ahead and exchange IUD. Complains of night sweats, tolerable. She reports abnormal pap in 2020 in [place], normal repeat. Anxiety managed by PCP.   Gynecologic History No LMP recorded. (Menstrual status: IUD).   Contraception: IUD Sexually active: Yes  Health Maintenance: Last Pap: 11/06/2022. Results were: Normal neg HPV Last mammogram: 06/11/2023. Results were: Stable left breast asymmetry Last colonoscopy: 2018 Last Dexa: Not indicated     11/09/2023    9:58 AM  Depression screen PHQ 2/9  Decreased Interest 0  Down, Depressed, Hopeless 0  PHQ - 2 Score 0     Past medical history, past surgical history, family history and social history were all reviewed and documented in the EPIC chart. From [place]. 31 yo and 29 yo sons. Emergency planning/management officer for [employer].  ROS:  A ROS was performed and pertinent positives and negatives are included.  Exam:  Vitals:   11/09/23 0952  BP: 100/80  Pulse: 62  SpO2: 98%  Weight: 145 lb (65.8 kg)  Height: 5' 3.78 (1.62 m)      Body mass index is 25.06 kg/m.  General appearance:  Normal Thyroid:  Symmetrical, normal in size, without palpable masses or nodularity. Respiratory  Auscultation:  Clear without wheezing or rhonchi Cardiovascular  Auscultation:  Regular rate, without rubs, murmurs or gallops  Edema/varicosities:  Not grossly evident Abdominal  Soft,nontender, without masses, guarding or rebound.  Liver/spleen:  No organomegaly noted  Hernia:  None appreciated  Skin  Inspection:  Grossly normal   Breasts: Examined lying and sitting.   Right: Without masses, retractions, discharge or axillary adenopathy.   Left: Without masses, retractions, discharge or axillary  adenopathy. Pelvic: External genitalia:  no lesions              Urethra:  normal appearing urethra with no masses, tenderness or lesions              Bartholins and Skenes: normal                 Vagina: normal appearing vagina with normal color and discharge, no lesions              Cervix: no lesions. + IUD strings Bimanual Exam:  Uterus:  no masses or tenderness              Adnexa: no mass, fullness, tenderness              Rectovaginal: Deferred              Anus:  normal, no lesions  Geni Pica, CMA present as chaperone.   Assessment/Plan:  43 y.o. [id number] for annual exam.   Well female exam with routine gynecological exam - Plan: CBC with Differential/Platelet, Comprehensive metabolic panel. Education provided on SBEs, importance of preventative screenings, current guidelines, high calcium  diet, regular exercise, and multivitamin daily.   Vitamin D  deficiency - Plan: VITAMIN D  25 Hydroxy (Vit-D Deficiency, Fractures)  Encounter for routine checking of intrauterine contraceptive device (IUD) - Mirena inserted 08/2018, occasional spotting. Normal exam. Aware of 8-year FDA approval.   Postcoital bleeding - Plan: US  PELVIS TRANSVAGINAL NON-OB (TV ONLY). Normal exam today.   Breakthrough bleeding with IUD - Plan: US  PELVIS TRANSVAGINAL NON-OB (TV ONLY).   Screening for  cervical cancer - Reports abnormal pap in 2020 in [place]. Will repeat at 5-year interval per guidelines.   Screening for breast cancer -  Stable benign fibroglandular tissue x 2 years. Continue annual screenings. Normal breast exam today.   Screening for colon cancer - Colonoscopy in 2018.  Return in about 1 year (around 11/08/2024) for Annual.      Paula Crosby, 10:18 AM 11/09/2023

## 2023-11-10 ENCOUNTER — Ambulatory Visit: Payer: Self-pay | Admitting: Nurse Practitioner

## 2023-11-10 ENCOUNTER — Other Ambulatory Visit: Payer: Self-pay | Admitting: Nurse Practitioner

## 2023-11-10 DIAGNOSIS — E559 Vitamin D deficiency, unspecified: Secondary | ICD-10-CM

## 2023-11-10 LAB — CBC WITH DIFFERENTIAL/PLATELET
Absolute Lymphocytes: 1933 {cells}/uL (ref 850–3900)
Absolute Monocytes: 594 {cells}/uL (ref 200–950)
Basophils Absolute: 59 {cells}/uL (ref 0–200)
Basophils Relative: 1.1 %
Eosinophils Absolute: 81 {cells}/uL (ref 15–500)
Eosinophils Relative: 1.5 %
HCT: 41.1 % (ref 35.0–45.0)
Hemoglobin: 13.8 g/dL (ref 11.7–15.5)
MCH: 31.9 pg (ref 27.0–33.0)
MCHC: 33.6 g/dL (ref 32.0–36.0)
MCV: 94.9 fL (ref 80.0–100.0)
MPV: 10.2 fL (ref 7.5–12.5)
Monocytes Relative: 11 %
Neutro Abs: 2732 {cells}/uL (ref 1500–7800)
Neutrophils Relative %: 50.6 %
Platelets: 292 Thousand/uL (ref 140–400)
RBC: 4.33 Million/uL (ref 3.80–5.10)
RDW: 11.8 % (ref 11.0–15.0)
Total Lymphocyte: 35.8 %
WBC: 5.4 Thousand/uL (ref 3.8–10.8)

## 2023-11-10 LAB — COMPREHENSIVE METABOLIC PANEL WITH GFR
AG Ratio: 1.8 (calc) (ref 1.0–2.5)
ALT: 10 U/L (ref 6–29)
AST: 14 U/L (ref 10–30)
Albumin: 4.4 g/dL (ref 3.6–5.1)
Alkaline phosphatase (APISO): 41 U/L (ref 31–125)
BUN: 12 mg/dL (ref 7–25)
CO2: 24 mmol/L (ref 20–32)
Calcium: 9 mg/dL (ref 8.6–10.2)
Chloride: 106 mmol/L (ref 98–110)
Creat: 0.71 mg/dL (ref 0.50–0.99)
Globulin: 2.5 g/dL (ref 1.9–3.7)
Glucose, Bld: 81 mg/dL (ref 65–99)
Potassium: 4.3 mmol/L (ref 3.5–5.3)
Sodium: 138 mmol/L (ref 135–146)
Total Bilirubin: 0.7 mg/dL (ref 0.2–1.2)
Total Protein: 6.9 g/dL (ref 6.1–8.1)
eGFR: 107 mL/min/1.73m2 (ref >=60)

## 2023-11-10 LAB — VITAMIN D 25 HYDROXY (VIT D DEFICIENCY, FRACTURES): Vit D, 25-Hydroxy: 24 ng/mL — ABNORMAL LOW (ref 30–100)

## 2023-11-10 MED ORDER — VITAMIN D (ERGOCALCIFEROL) 1.25 MG (50000 UNIT) PO CAPS: 50000.0000 [IU] | ORAL_CAPSULE | ORAL | 0 refills | Status: DC

## 2023-11-17 ENCOUNTER — Ambulatory Visit (INDEPENDENT_AMBULATORY_CARE_PROVIDER_SITE_OTHER)

## 2023-11-17 ENCOUNTER — Ambulatory Visit
Admission: EM | Admit: 2023-11-17 | Discharge: 2023-11-17 | Disposition: A | Discharge status: Home / Self Care | Attending: Family Medicine | Admitting: Family Medicine

## 2023-11-17 ENCOUNTER — Ambulatory Visit: Payer: Self-pay | Admitting: Nurse Practitioner

## 2023-11-17 ENCOUNTER — Other Ambulatory Visit: Payer: Self-pay

## 2023-11-17 DIAGNOSIS — R1033 Periumbilical pain: Secondary | ICD-10-CM

## 2023-11-17 DIAGNOSIS — R82998 Other abnormal findings in urine: Secondary | ICD-10-CM | POA: Insufficient documentation

## 2023-11-17 LAB — POCT URINE DIPSTICK
Bilirubin, UA: NEGATIVE
Glucose, UA: NEGATIVE mg/dL
Ketones, POC UA: NEGATIVE mg/dL
Nitrite, UA: NEGATIVE
POC PROTEIN,UA: NEGATIVE
Spec Grav, UA: 1.01 (ref 1.010–1.025)
Urobilinogen, UA: 0.2 U/dL
pH, UA: 6.5 (ref 5.0–8.0)

## 2023-11-17 LAB — POCT URINE PREGNANCY: Preg Test, Ur: NEGATIVE

## 2023-11-17 NOTE — Discharge Instructions (Addendum)
 Please follow-up with your PCP as soon as possible for further workup of your symptoms.  Please go to the ER if you develop any worsening symptoms.  Hope you feel better soon!

## 2023-11-17 NOTE — ED Triage Notes (Signed)
 Pt c/o right mid abdominal pain and nausea, loss of appetitex1wk.

## 2023-11-17 NOTE — ED Provider Notes (Signed)
 [facility]    CSN: [id number] Arrival date & time: 11/17/23  1116      History   Chief Complaint No chief complaint on file.   HPI Zalika Tieszen is a 43 y.o. female with a past medical history of migraines, anxiety presents for abdominal pain.  Patient reports 1 week of a intermittent periumbilical abdominal pain that does not radiate.  States frequency is increasing.  Does endorse nausea with decreased/lack of appetite but denies any vomiting.  Did have some diarrhea that was nonbloody.  Denies any fevers chills or respiratory symptoms.  No bloating.  No history of abdominal diagnoses such as Crohn's, IBS, colitis.  No abdominal surgeries.  Is able to stay hydrated.  Has not taken any OTC treatments for symptoms.  No other concerns at this time.  HPI  Past Medical History:  Diagnosis Date   Anxiety    HPV (human papilloma virus) infection    Migraines    Syncope and collapse     Patient Active Problem List   Diagnosis Date Noted   Routine general medical examination at a health care facility 09/01/2023   Encounter for screening for HIV 09/01/2023   Need for hepatitis C screening test 09/01/2023   GAD (generalized anxiety disorder) 09/01/2023   Encounter for general adult medical examination with abnormal findings 05/21/2020   Intractable migraine without aura and with status migrainosus 05/16/2020   Glaucoma of both eyes 05/16/2020   Encounter for other specified surgical aftercare 01/03/2019   Acquired hallux valgus of left foot 12/17/2018   Bunion of great toe of left foot 12/17/2018   Hallux valgus 10/05/2018    Past Surgical History:  Procedure Laterality Date   BUNIONECTOMY     COLONOSCOPY     polyps in small intestine removed    COLPOSCOPY  2020   DILATION AND CURETTAGE OF UTERUS      OB History     Gravida  3   Para  2   Term      Preterm      AB  1   Living  2      SAB  1   IAB      Ectopic      Multiple      Live Births                Home Medications    Prior to Admission medications   Medication Sig Start Date End Date Taking? Authorizing Provider  levonorgestrel (MIRENA) 20 MCG/DAY IUD 1 each by Intrauterine route once.    [provider]  melatonin 1 MG TABS tablet Take 3 mg by mouth at bedtime.    [provider]  rizatriptan  (MAXALT ) 5 MG tablet Take 1 tablet (5 mg total) by mouth as needed for migraine. May repeat in 2 hours if needed 04/07/23   Geofm Glade PARAS, MD  sertraline  (ZOLOFT ) 100 MG tablet Take 1 tablet (100 mg total) by mouth daily. 09/01/23   Joshua Debby CROME, MD  Vitamin D , Ergocalciferol , (DRISDOL ) 1.25 MG (50000 UNIT) CAPS capsule Take 1 capsule (50,000 Units total) by mouth every 7 (seven) days for 8 doses. 11/10/23 12/30/23  Prentiss Annabella LABOR, NP    Family History Family History  Problem Relation Age of Onset   Alcohol abuse Mother    Depression Mother    Migraines Mother    Healthy Sister    Alcohol abuse Brother    Depression Brother    Hypertension  Brother    Migraines Brother    Healthy Brother    Heart disease Maternal Grandfather    Congestive Heart Failure Maternal Grandfather    Diabetes Maternal Grandfather    Hypertension Maternal Grandfather     Social History Social History   Tobacco Use   Smoking status: Never    Passive exposure: Current   Smokeless tobacco: Never  Vaping Use   Vaping status: Never Used  Substance Use Topics   Alcohol use: Yes    Alcohol/week: 1.0 standard drink of alcohol    Types: 1 Glasses of wine per week    Comment: RARE   Drug use: Never     Allergies   Patient has no known allergies.   Review of Systems Review of Systems  Constitutional:  Positive for appetite change.  Gastrointestinal:  Positive for abdominal pain and nausea.     Physical Exam Triage Vital Signs ED Triage Vitals  Encounter Vitals Group     BP 11/17/23 1127 106/72     Girls Systolic BP Percentile --      Girls Diastolic BP  Percentile --      Boys Systolic BP Percentile --      Boys Diastolic BP Percentile --      Pulse Rate 11/17/23 1127 60     Resp 11/17/23 1127 16     Temp 11/17/23 1127 98.3 F (36.8 C)     Temp Source 11/17/23 1127 Oral     SpO2 11/17/23 1127 98 %     Weight --      Height --      Head Circumference --      Peak Flow --      Pain Score 11/17/23 1126 3     Pain Loc --      Pain Education --      Exclude from Growth Chart --    No data found.  Updated Vital Signs BP 106/72   Pulse 60   Temp 98.3 F (36.8 C) (Oral)   Resp 16   SpO2 98%   Visual Acuity Right Eye Distance:   Left Eye Distance:   Bilateral Distance:    Right Eye Near:   Left Eye Near:    Bilateral Near:     Physical Exam Vitals and nursing note reviewed.  Constitutional:      General: She is not in acute distress.    Appearance: Normal appearance. She is not ill-appearing.  HENT:     Head: Normocephalic and atraumatic.  Eyes:     Pupils: Pupils are equal, round, and reactive to light.  Cardiovascular:     Rate and Rhythm: Normal rate.  Pulmonary:     Effort: Pulmonary effort is normal.  Abdominal:     General: Abdomen is flat. Bowel sounds are normal.     Palpations: Abdomen is soft. There is no hepatomegaly or splenomegaly.     Tenderness: There is abdominal tenderness in the periumbilical area.     Hernia: There is no hernia in the umbilical area.   Skin:    General: Skin is warm and dry.  Neurological:     General: No focal deficit present.     Mental Status: She is alert and oriented to person, place, and time.  Psychiatric:        Mood and Affect: Mood normal.        Behavior: Behavior normal.      UC Treatments / Results  Labs (all labs  ordered are listed, but only abnormal results are displayed) Labs Reviewed  POCT URINE DIPSTICK - Abnormal; Notable for the following components:      Result Value   Blood, UA trace-intact (*)    Leukocytes, UA Trace (*)    All other  components within normal limits  URINE CULTURE  POCT URINE PREGNANCY    EKG   Radiology No results found.  Procedures Procedures (including critical care time)  Medications Ordered in UC Medications - No data to display  Initial Impression / Assessment and Plan / UC Course  I have reviewed the triage vital signs and the nursing notes.  Pertinent labs & imaging results that were available during my care of the patient were reviewed by me and considered in my medical decision making (see chart for details).     Reviewed exam and symptoms with patient.  Urine with trace leuks and trace blood, patient has no dysuria or CVAT.  Will send urine culture and contact if positive.  Prefers to wait results prior to initiating treatment.  Discussed wet read of abdominal x-ray without obvious abnormalities, will contact for any positive results based on radiology overread.  Given her persistent symptoms discussed ER evaluation but she declined at this time her PCP.  I did advise that if she has any worsening symptoms before seeing her PCP she needs to go to the emergency room ASAP and she verbalized understanding. Final Clinical Impressions(s) / UC Diagnoses   Final diagnoses:  Periumbilical abdominal pain  Leukocytes in urine     Discharge Instructions      Please follow-up with your PCP as soon as possible for further workup of your symptoms.  Please go to the ER if you develop any worsening symptoms.  Hope you feel better soon!     ED Prescriptions   None    PDMP not reviewed this encounter.   Loreda Myla SAUNDERS, NP 11/17/23 1310

## 2023-11-18 ENCOUNTER — Ambulatory Visit: Payer: Self-pay

## 2023-11-19 LAB — URINE CULTURE: Culture: <10000 — AB

## 2023-12-08 NOTE — Progress Notes (Unsigned)
   Acute Office Visit  Subjective:    Patient ID: Paula Crosby, female    DOB: 1980-09-18, 43 y.o.   MRN: [id number]   HPI 43 y.o. presents today for ultrasound. Seen 11/09/23 with complaints of spotting and postcoital bleeding. Mirena IUD 08/2018. No pain with intercourse. Does have intermittent LLQ pain. Does not feel it is related to eating. Has alternating diarrhea and constipation. Taking probiotic. Colonoscopy years ago.   No LMP recorded. (Menstrual status: IUD).    Review of Systems  Constitutional: Negative.   Gastrointestinal:  Positive for abdominal pain (LLQ), constipation and diarrhea. Negative for blood in stool.  Genitourinary:  Positive for vaginal bleeding. Negative for dyspareunia.       Objective:    Physical Exam Constitutional:      Appearance: Normal appearance.     BP 114/68   Pulse (!) 53   SpO2 99%  Wt Readings from Last 3 Encounters:  11/09/23 145 lb (65.8 kg)  09/01/23 148 lb 3.2 oz (67.2 kg)  04/15/23 150 lb 9.6 oz (68.3 kg)        Assessment & Plan:   Problem List Items Addressed This Visit   None Visit Diagnoses       Postcoital bleeding    -  Primary     Alternating constipation and diarrhea       Relevant Orders   Ambulatory referral to Gastroenterology     Left lower quadrant abdominal pain       Relevant Orders   Ambulatory referral to Gastroenterology     Breakthrough bleeding with IUD          Vaginal ultrasound: Anteverted uterus, normal size and shape, no myometrial masses.  Thin, symmetrical endometrium 3.4 mm.  No masses, thickening or abnormal blood flow seen.  IUD is noted in proper position within uterine cavity.  Both ovaries mobile, normal size with normal follicle pattern and normal perfusion.  2.7 x 2.4 cm simple dominant follicle on right side.  No adnexal masses, no free fluid.  Plan: Reviewed normal ultrasound findings. If spotting continues can consider exchanging IUD. Based on pain described today this appears  to be GI-related.  Continue probiotic.   Return if symptoms worsen or fail to improve.    Annabella DELENA Shutter DNP, 9:50 AM 12/09/2023

## 2023-12-09 ENCOUNTER — Ambulatory Visit (INDEPENDENT_AMBULATORY_CARE_PROVIDER_SITE_OTHER)

## 2023-12-09 ENCOUNTER — Ambulatory Visit (INDEPENDENT_AMBULATORY_CARE_PROVIDER_SITE_OTHER): Admitting: Nurse Practitioner

## 2023-12-09 ENCOUNTER — Encounter: Payer: Self-pay | Admitting: Nurse Practitioner

## 2023-12-09 VITALS — BP 114/68 | HR 53

## 2023-12-09 DIAGNOSIS — Z975 Presence of (intrauterine) contraceptive device: Secondary | ICD-10-CM

## 2023-12-09 DIAGNOSIS — R198 Other specified symptoms and signs involving the digestive system and abdomen: Secondary | ICD-10-CM

## 2023-12-09 DIAGNOSIS — N930 Postcoital and contact bleeding: Secondary | ICD-10-CM | POA: Diagnosis not present

## 2023-12-09 DIAGNOSIS — N921 Excessive and frequent menstruation with irregular cycle: Secondary | ICD-10-CM

## 2023-12-09 DIAGNOSIS — R1032 Left lower quadrant pain: Secondary | ICD-10-CM | POA: Diagnosis not present

## 2024-01-05 ENCOUNTER — Other Ambulatory Visit: Payer: Self-pay

## 2024-01-05 ENCOUNTER — Encounter: Payer: Self-pay | Admitting: Nurse Practitioner

## 2024-01-05 NOTE — Telephone Encounter (Signed)
 Med refill request: vitamin D -Vitamin D  deficiency [E55.9]  Last AEX: 11/09/23 Next AEX: 11/10/24 Last MMG (if hormonal med) Refill authorized: last rx 11/10/23 #8 with 0 refills. Please advise

## 2024-01-06 ENCOUNTER — Other Ambulatory Visit: Payer: Self-pay | Admitting: Nurse Practitioner

## 2024-01-06 DIAGNOSIS — N760 Acute vaginitis: Secondary | ICD-10-CM

## 2024-01-06 MED ORDER — METRONIDAZOLE 500 MG PO TABS: 500.0000 mg | ORAL_TABLET | Freq: Two times a day (BID) | ORAL | 0 refills | Status: AC

## 2024-11-10 ENCOUNTER — Ambulatory Visit: Admitting: Nurse Practitioner
